# Patient Record
Sex: Male | Born: 1965 | Race: Black or African American | Hispanic: No | Marital: Married | Smoking: Current every day smoker
Health system: Southern US, Community
[De-identification: ages and names within clinical notes are randomized; demographics above are authoritative.]

## PROBLEM LIST (undated history)

## (undated) DIAGNOSIS — Z720 Tobacco use: Secondary | ICD-10-CM

## (undated) DIAGNOSIS — I10 Essential (primary) hypertension: Secondary | ICD-10-CM

## (undated) DIAGNOSIS — T7840XA Allergy, unspecified, initial encounter: Secondary | ICD-10-CM

## (undated) DIAGNOSIS — K219 Gastro-esophageal reflux disease without esophagitis: Secondary | ICD-10-CM

## (undated) HISTORY — DX: Tobacco use: Z72.0

## (undated) HISTORY — DX: Essential (primary) hypertension: I10

## (undated) HISTORY — DX: Gastro-esophageal reflux disease without esophagitis: K21.9

## (undated) HISTORY — DX: Allergy, unspecified, initial encounter: T78.40XA

## (undated) HISTORY — PX: BACK SURGERY: SHX140

---
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
## 2008-04-07 ENCOUNTER — Ambulatory Visit: Payer: Self-pay | Admitting: Family Medicine

## 2008-04-07 DIAGNOSIS — F17200 Nicotine dependence, unspecified, uncomplicated: Secondary | ICD-10-CM | POA: Insufficient documentation

## 2008-04-07 DIAGNOSIS — K219 Gastro-esophageal reflux disease without esophagitis: Secondary | ICD-10-CM

## 2008-04-08 ENCOUNTER — Encounter: Payer: Self-pay | Admitting: Family Medicine

## 2008-04-08 ENCOUNTER — Ambulatory Visit: Payer: Self-pay | Admitting: Family Medicine

## 2008-04-10 LAB — CONVERTED CEMR LAB
Albumin: 4.2 g/dL (ref 3.5–5.2)
Alkaline Phosphatase: 83 units/L (ref 39–117)
BUN: 14 mg/dL (ref 6–23)
Calcium: 9.2 mg/dL (ref 8.4–10.5)
Chloride: 108 meq/L (ref 96–112)
Creatinine, Ser: 0.99 mg/dL (ref 0.40–1.50)
Glucose, Bld: 94 mg/dL (ref 70–99)
HCT: 43.4 % (ref 39.0–52.0)
HDL: 72 mg/dL (ref >39)
Hemoglobin: 14.4 g/dL (ref 13.0–17.0)
MCHC: 33.2 g/dL (ref 30.0–36.0)
Potassium: 4.1 meq/L (ref 3.5–5.3)
RBC: 4.98 M/uL (ref 4.22–5.81)
Total CHOL/HDL Ratio: 2.2
Triglycerides: 84 mg/dL (ref <150)

## 2008-05-06 ENCOUNTER — Ambulatory Visit: Payer: Self-pay | Admitting: Family Medicine

## 2008-07-10 ENCOUNTER — Telehealth: Payer: Self-pay | Admitting: Family Medicine

## 2008-07-14 ENCOUNTER — Telehealth: Payer: Self-pay | Admitting: Family Medicine

## 2008-08-16 ENCOUNTER — Emergency Department (HOSPITAL_COMMUNITY): Admission: EM | Admit: 2008-08-16 | Discharge: 2008-08-16 | Payer: Self-pay | Admitting: Emergency Medicine

## 2008-10-20 ENCOUNTER — Telehealth: Payer: Self-pay | Admitting: *Deleted

## 2008-10-21 ENCOUNTER — Ambulatory Visit: Payer: Self-pay | Admitting: Family Medicine

## 2008-12-03 ENCOUNTER — Telehealth: Payer: Self-pay | Admitting: Family Medicine

## 2009-12-07 ENCOUNTER — Encounter: Payer: Self-pay | Admitting: Family Medicine

## 2009-12-07 ENCOUNTER — Ambulatory Visit: Payer: Self-pay | Admitting: Family Medicine

## 2009-12-07 LAB — CONVERTED CEMR LAB
ALT: 16 units/L (ref 0–53)
AST: 20 units/L (ref 0–37)
Alkaline Phosphatase: 76 units/L (ref 39–117)
Cholesterol: 165 mg/dL (ref 0–200)
Creatinine, Ser: 1.04 mg/dL (ref 0.40–1.50)
HCT: 44.6 % (ref 39.0–52.0)
LDL Cholesterol: 86 mg/dL (ref 0–99)
MCV: 87.6 fL (ref 78.0–100.0)
Platelets: 170 10*3/uL (ref 150–400)
RDW: 15.1 % (ref 11.5–15.5)
Sodium: 140 meq/L (ref 135–145)
Total Bilirubin: 0.5 mg/dL (ref 0.3–1.2)
Total CHOL/HDL Ratio: 2.6
VLDL: 16 mg/dL (ref 0–40)

## 2009-12-08 ENCOUNTER — Encounter: Payer: Self-pay | Admitting: Family Medicine

## 2010-03-30 NOTE — Assessment & Plan Note (Signed)
Summary: cpe,df   Vital Signs:  Patient profile:   45 year old male Height:      73 inches Weight:      212 pounds BMI:     28.07 Temp:     98.7 degrees F oral Pulse rate:   73 / minute BP sitting:   117 / 82  (left arm) Cuff size:   regular  Vitals Entered By: Tessie Fass CMA (December 07, 2009 8:46 AM) CC: CPE Is Patient Diabetic? No Pain Assessment Patient in pain? no        Primary Care Provider:  . [facility]  CC:  CPE.  History of Present Illness: Patient here for complete physical exam.   1.  GERD:  Only complaint is occasional GERD.  States he knows when pain is going to occur, often after eating spicy foods.  Was on two times a day dosing of Omeprazole in past, no longer needs this much.  No warning signs, recent weight loss, nausea or vomiting.  Well controlled with meds.  Does have history of esophageal surgery in 1991, unsure what it was for.    ROS:  no headaches, pre-syncopal or syncopal episodes, chest pain, palpitations, shortness of breath or dyspnea, abdominal pain, diarrhea or constipation, melena, hematochezia, lower extremity swelling.    Habits & Providers  Alcohol-Tobacco-Diet     Tobacco Status: current     Tobacco Counseling: to quit use of tobacco products     Cigarette Packs/Day: 1.0  Current Problems (verified): 1)  Health Maintenance Exam  (ICD-V70.0) 2)  Gerd  (ICD-530.81) 3)  Tobacco User  (ICD-305.1)  Current Medications (verified): 1)  Gnp Omeprazole 20 Mg Tbec (Omeprazole) .Marland Kitchen.. 1 Tabs By Mouth 1 Times A Day As Needed For Reflux  Allergies (verified): 1)  ! * Shellfish  Past History:  Past medical, surgical, family and social histories (including risk factors) reviewed, and no changes noted (except as noted below).  Past Medical History: Reviewed history from 04/07/2008 and no changes required. Season Allergic Rhinitis GERD Tobacco use  Past Surgical History: Reviewed history from 04/07/2008 and no changes  required. Esophageal surgery- 1991  Family History: Reviewed history from 04/07/2008 and no changes required. Father- living, healthy Mother- DM II Brother- healthy Sister- healthy  Social History: Reviewed history from 04/07/2008 and no changes required. Divorced.  4 children, all in pretty good health but do not live in [place].  Employed at [employer]).   Tobacco 5-6cig/day x20 EtOH- beer 3x/wk No illcit drugs Exercise- NO Demetrice phone- [phone]/Day:  1.0  Physical Exam  General:  Vital signs reviewed. Well-developed, well-nourished patient in NAD.  Awake and cooperative  Head:  normocephalic and atraumatic.   Eyes:  vision grossly intact, pupils equal, pupils round, and pupils reactive to light.   Ears:  R ear normal and L ear normal.   Nose:  External nasal examination shows no deformity or inflammation. Nasal mucosa are pink and moist without lesions or exudates. Mouth:  Oral mucosa pink and moist Dentition:  fair Neck:  No deformities, masses, or tenderness noted. Lungs:  clear to auscultation bilaterally without wheezing, rales, or rhonchi.  Normal work of breathing  Heart:  Regular rate and rhythm without murmur, rub, or gallop.  Normal S1/physiologically split S2 Abdomen:  soft/nondistended/nontender.  Bowel sounds present and normoactive.  No organomegaly noted.   Msk:  No deformity or scoliosis noted of thoracic or lumbar spine.   Pulses:  Good distal pulses BL LE's Extremities:  No clubbing, cyanosis, edema, or deformity noted with normal full range of motion of all joints.  No LE edema Neurologic:  No focal deficits.  alert & oriented X3.   Skin:  No rashes or lesions Psych:  Oriented X3, memory intact for recent and remote, normally interactive, good eye contact, not anxious appearing, and not depressed appearing.     Impression & Recommendations:  Problem # 1:  HEALTH MAINTENANCE EXAM (ICD-V70.0) Assessment Comment Only Patient doing well,  will check lipids, BMET, CBC today.  No complaints or concerns, blood pressure good today.  To follow up in 6 months.   Orders: Lipid-[facility] ([id number]) CBC-[facility] (57846)  Problem # 2:  GERD (ICD-530.81) Will continue home omeprazole.   Orders: Lipid-[facility] ([id number]) CBC-[facility] ([id number])  His updated medication list for this problem includes:    Gnp Omeprazole 20 Mg Tbec (Omeprazole) .Marland Kitchen... 1 tabs by mouth 1 times a day as needed for reflux  Problem # 3:  TOBACCO USER (ICD-305.1) Assessment: Unchanged Contemplative.  Offered choices to quitting.  States he would like to think about it.  Will follow up Orders: [facility]- Est Level  3 (02725)  Complete Medication List: 1)  Gnp Omeprazole 20 Mg Tbec (Omeprazole) .Marland Kitchen.. 1 tabs by mouth 1 times a day as needed for reflux  Other Orders: Comp Met-[facility] ([id number])  Patient Instructions: 1)  Follow-up with me in 6 months.  2)  Everything is looking good today.   3)  We will check your cholesterol, blood tests for kidney function.   4)  We will let you know the results.  Prescriptions: GNP OMEPRAZOLE 20 MG TBEC (OMEPRAZOLE) 1 tabs by mouth 1 times a day as needed for reflux  #60 x 3   Entered and Authorized by:   Renold Don MD   Signed by:   Renold Don MD on 12/07/2009   Method used:   Electronically to        [facility]. [phone]* (retail)       [address]       [place], [place]  [address]       Ph: [phone]       Fax: [phone]   RxID:   [id number]

## 2010-03-30 NOTE — Letter (Signed)
Summary: Results Follow-up Letter  [facility]  [address]   [place], [place] [address]   Phone: [phone]  Fax: [phone]    12/08/2009  [address] [place], [place]  [address]  Dear Mr. PIZANO,   The following are the results of your recent test(s):   Cholesterol LDL(Bad cholesterol):     86    Your goal is less than:    130     HDL (Good cholesterol):   63    Your goal is more than:     40 _________________________________________________________ Other Tests:  As you can see above, your cholesterol levels were very good.  All of your other blood tests were perfectly normal.  It was good to see you in clinic yesterday.  Please call if you have any questions.  _________________________________________________________  Please call for an appointment Or _________________________________________________________ _________________________________________________________ _________________________________________________________  Sincerely,  Renold Don MD [facility]           Appended Document: Results Follow-up Letter MAILED.

## 2010-04-18 ENCOUNTER — Encounter: Payer: Self-pay | Admitting: *Deleted

## 2010-05-11 ENCOUNTER — Encounter: Payer: Self-pay | Admitting: Sports Medicine

## 2010-05-11 ENCOUNTER — Ambulatory Visit (INDEPENDENT_AMBULATORY_CARE_PROVIDER_SITE_OTHER): Payer: 59 | Admitting: Sports Medicine

## 2010-05-11 VITALS — BP 120/88 | HR 100 | Temp 97.9°F | Ht 76.0 in | Wt 226.0 lb

## 2010-05-11 DIAGNOSIS — H9209 Otalgia, unspecified ear: Secondary | ICD-10-CM

## 2010-05-11 DIAGNOSIS — H9201 Otalgia, right ear: Secondary | ICD-10-CM | POA: Insufficient documentation

## 2010-05-11 MED ORDER — AMOXICILLIN-POT CLAVULANATE 875-125 MG PO TABS: 1.0000 | ORAL_TABLET | Freq: Two times a day (BID) | ORAL | Status: AC

## 2010-05-11 MED ORDER — BENZOCAINE 20 % MT GEL: OROMUCOSAL | Status: DC

## 2010-05-11 MED ORDER — MELOXICAM 15 MG PO TABS: 15.0000 mg | ORAL_TABLET | Freq: Every day | ORAL | Status: AC

## 2010-05-11 NOTE — Progress Notes (Signed)
  Subjective:    Patient ID: Chris Hunt, male    DOB: 06/03/1965, 45 y.o.   MRN: [id number]  HPI 1-2d of R ear pain, R jaw pain, R mandibular molar pain particularly with drinking cold beverages.  No ear drainage, no fevers/chills, no change in hearing, some ST, minimal pain with swallowing.  Occasional NP cough, no sick contacts, no neck pain/stiffness.  Has had several teeth pulled in the past.  No pain with opening/closing mouth.  No catching, clicking, locking with opening/closing jaws.   Review of Systems    See HPI Objective:   Physical Exam  Constitutional: He appears well-developed and well-nourished. No distress.  HENT:  Head: Normocephalic and atraumatic.  Right Ear: External ear normal.  Left Ear: External ear normal.  Nose: Nose normal.  Mouth/Throat: Oropharynx is clear and moist. No oropharyngeal exudate.       Several teeth/molars with necrotic caries. No TTP over TMJ. No catching, locking felt with TMJ palpation.  Eyes: Conjunctivae are normal. Pupils are equal, round, and reactive to light. Right eye exhibits no discharge. Left eye exhibits no discharge.  Neck: Normal range of motion. Neck supple. No thyromegaly present.  Lymphadenopathy:    He has no cervical adenopathy.  Skin: Skin is warm and dry.          Assessment & Plan:

## 2010-05-11 NOTE — Assessment & Plan Note (Signed)
Likely referred from teeth. No signs TMJ dysfunction. Noted necrotic dental caries. Augmentin x 10d. Mobic. Orajel topical. Sensodyne toothpaste. Dental referral.

## 2010-05-11 NOTE — Patient Instructions (Addendum)
Dental Pain (Tooth Ache) A tooth ache may be caused by cavities (tooth decay). Cavities expose the nerve of the tooth to air and hot or cold temperatures. It may come from an infection or abscess (also called a boil or furuncle) around your tooth. It is also often caused by dental caries (tooth decay). This causes the pain you are having. DIAGNOSIS   Your caregiver can diagnose this problem by exam. TREATMENT  If caused by an infection, it may be treated with medications which kill germs (antibiotics) and pain medications as prescribed by your caregiver. Take medications as directed.   Only take over-the-counter or prescription medicines for pain, discomfort, or fever as directed by your caregiver.   Whether the tooth ache today is caused by infection or dental disease, you should see your dentist as soon as possible for further care.  SEEK MEDICAL CARE: The exam and treatment you received today has been provided on an emergency basis only. This is not a substitute for complete medical or dental care. If your problem worsens or new problems (symptoms) appear, and you are unable to meet with your dentist, call or return to this location. SEEK IMMEDIATE MEDICAL CARE IF:  You develop a fever over 100.85F.   You develop redness and swelling of your face, jaw, or neck.   You are unable to open your mouth.   You have severe pain uncontrolled by pain medicine.  MAKE SURE YOU:    Understand these instructions.   Will watch your condition.   Will get help right away if you are not doing well or get worse.  Document Released: 02/14/2005 Document Re-Released: 01/28/2008 [facility] Patient Information 2011 [place], [place].

## 2010-06-07 LAB — POCT I-STAT, CHEM 8
BUN: 10 mg/dL (ref 6–23)
Calcium, Ion: 1.17 mmol/L (ref 1.12–1.32)
Chloride: 105 mEq/L (ref 96–112)
Creatinine, Ser: 1.1 mg/dL (ref 0.4–1.5)
Glucose, Bld: 92 mg/dL (ref 70–99)
HCT: 44 % (ref 39.0–52.0)
Hemoglobin: 15 g/dL (ref 13.0–17.0)
Potassium: 3.9 mEq/L (ref 3.5–5.1)
Sodium: 140 meq/L (ref 135–145)
TCO2: 25 mmol/L (ref 0–100)

## 2010-06-07 LAB — DIFFERENTIAL
Basophils Absolute: 0 K/uL (ref 0.0–0.1)
Basophils Relative: 0 % (ref 0–1)
Eosinophils Absolute: 0.1 K/uL (ref 0.0–0.7)
Eosinophils Relative: 2 % (ref 0–5)
Lymphocytes Relative: 34 % (ref 12–46)
Lymphs Abs: 2.2 K/uL (ref 0.7–4.0)
Monocytes Absolute: 0.4 K/uL (ref 0.1–1.0)
Monocytes Relative: 7 % (ref 3–12)
Neutro Abs: 3.6 10*3/uL (ref 1.7–7.7)
Neutrophils Relative %: 57 % (ref 43–77)

## 2010-06-07 LAB — CBC
HCT: 43.2 % (ref 39.0–52.0)
Hemoglobin: 14.4 g/dL (ref 13.0–17.0)
MCHC: 33.4 g/dL (ref 30.0–36.0)
MCV: 88 fL (ref 78.0–100.0)
Platelets: 179 K/uL (ref 150–400)
RBC: 4.91 MIL/uL (ref 4.22–5.81)
RDW: 14.9 % (ref 11.5–15.5)
WBC: 6.4 10*3/uL (ref 4.0–10.5)

## 2010-08-31 ENCOUNTER — Other Ambulatory Visit: Payer: Self-pay | Admitting: Family Medicine

## 2010-08-31 NOTE — Telephone Encounter (Signed)
Refill request

## 2010-10-14 ENCOUNTER — Ambulatory Visit (INDEPENDENT_AMBULATORY_CARE_PROVIDER_SITE_OTHER): Payer: 59 | Admitting: Family Medicine

## 2010-10-14 VITALS — BP 120/77 | HR 77 | Temp 98.0°F | Wt 221.0 lb

## 2010-10-14 DIAGNOSIS — K219 Gastro-esophageal reflux disease without esophagitis: Secondary | ICD-10-CM

## 2010-10-14 DIAGNOSIS — J988 Other specified respiratory disorders: Secondary | ICD-10-CM

## 2010-10-14 DIAGNOSIS — J22 Unspecified acute lower respiratory infection: Secondary | ICD-10-CM | POA: Insufficient documentation

## 2010-10-14 DIAGNOSIS — F17200 Nicotine dependence, unspecified, uncomplicated: Secondary | ICD-10-CM

## 2010-10-14 MED ORDER — OMEPRAZOLE 20 MG PO CPDR: 20.0000 mg | DELAYED_RELEASE_CAPSULE | Freq: Two times a day (BID) | ORAL | Status: DC

## 2010-10-14 MED ORDER — AMOXICILLIN-POT CLAVULANATE 875-125 MG PO TABS: 1.0000 | ORAL_TABLET | Freq: Two times a day (BID) | ORAL | Status: DC

## 2010-10-14 MED ORDER — DOXYCYCLINE HYCLATE 50 MG PO CAPS: 50.0000 mg | ORAL_CAPSULE | Freq: Two times a day (BID) | ORAL | Status: AC

## 2010-10-14 NOTE — Assessment & Plan Note (Signed)
Imp: respiratory infection. Most likely viral, but given risk factors (smoker) will treat for bacterial pathogens. Plan: Doxy x 7 days. Prn cough medicine. Anticipate improvement in next few days. RTC if no improvement.

## 2010-10-14 NOTE — Progress Notes (Signed)
  Subjective:    Patient ID: Chris Hunt, male    DOB: 02-Jun-1965, 45 y.o.   MRN: [id number]  HPI Pt here with 4 days of chest congestion and fatigue. He states that his symptoms started last Saturday with runny nose and AM post nasal drip. By day #3 she has productive cough. He also endorses increased fatigue, SOB. He denies fever, chills, CP, N/V, change in appetite. He is a smoker. He smokes 1/2 PPD x 15 years. He denies sick contacts.   Smoking: pt has tried to quit in the past. Is not planning to quit now. He used the nicotine patch during his last quit attempt. He denies chronic cough.    Review of Systems As per HPI    Objective:   Physical Exam  Nursing note and vitals reviewed. Constitutional: He appears well-developed and well-nourished.  Cardiovascular: Normal rate, regular rhythm and normal heart sounds.   Pulmonary/Chest: Effort normal.    Abdominal: Soft. Bowel sounds are normal.  Lymphadenopathy:    He has no cervical adenopathy.  Skin: Skin is warm and dry.          Assessment & Plan:

## 2010-10-14 NOTE — Assessment & Plan Note (Signed)
Imp: contemplation Plan: address at all f/u visits. When pt in action stage of quitting, offer resources/pharmocotherapy.

## 2010-10-14 NOTE — Patient Instructions (Signed)
Mr. Boldman,  Please take your antibiotics and complete the course. You can continue to take the cough medicine. You should start to feel better in the next 3-4 days. Come back if you are not improving.  Think more about the smoking cessation. We would be happy to help you quit.   -Dr. Armen Pickup

## 2010-11-09 ENCOUNTER — Ambulatory Visit
Admission: RE | Admit: 2010-11-09 | Discharge: 2010-11-09 | Disposition: A | Discharge status: Home / Self Care | Payer: 59 | Source: Ambulatory Visit | Attending: Family Medicine | Admitting: Family Medicine

## 2010-11-09 ENCOUNTER — Ambulatory Visit (INDEPENDENT_AMBULATORY_CARE_PROVIDER_SITE_OTHER): Payer: 59 | Admitting: Family Medicine

## 2010-11-09 ENCOUNTER — Encounter: Payer: Self-pay | Admitting: Family Medicine

## 2010-11-09 VITALS — BP 128/89 | HR 68 | Temp 98.2°F | Ht 73.0 in | Wt 222.2 lb

## 2010-11-09 DIAGNOSIS — R05 Cough: Secondary | ICD-10-CM

## 2010-11-09 MED ORDER — AMOXICILLIN-POT CLAVULANATE 875-125 MG PO TABS: 1.0000 | ORAL_TABLET | Freq: Two times a day (BID) | ORAL | Status: AC

## 2010-11-09 NOTE — Assessment & Plan Note (Signed)
One month of persistent productive cough, congestion, and fatigue. Most likely a chronic sinusitis from viral syndrome, but will treat with 5 days augmentin to cover for possible bacterial pathogens. With new back pain and history of smoking, will obtain chest xray to rule out lobar pneumonia although no lung exam findings today. Counseled regarding smoking cessation. Symptomatic treatment with mucinex-d and motrin for back pain. F/u in 2 weeks if not improved.

## 2010-11-09 NOTE — Patient Instructions (Signed)
Nice to meet you. Get your chest xray. I will call with results. Start antibiotics. Most likely your symptoms are from a sinus infection. You may take motrin 600mg  up to 4 times daily for back pain. Make appointment if not improved in 2 weeks.

## 2010-11-09 NOTE — Progress Notes (Signed)
  Subjective:    Patient ID: Chris Hunt, male    DOB: 1965/06/11, 45 y.o.   MRN: [id number]  HPI 1. Cough and congestion. Was evaluated 3 weeks ago for cough/URI and treated with 7 days doxycycline. Since that time cough has persisted productive of yellow mucus. Feels congestion in his chest and his upper resp tract. Post-tussive emesis, throat is sore. Having frontal headaches intermittently and pain around eyes. Associated fatigue and has developed soreness in his back from coughing. Tried mucinex that helps modestly. Denies fevers, night sweats, hemoptysis, abdominal pain, dyspnea, wheezing.   Smokes half pack per day which is decreased from previous.  Review of Systems See HPI otherwise negative    Objective:   Physical Exam  Vitals reviewed. Constitutional: He is oriented to person, place, and time. He appears well-developed and well-nourished. No distress.  HENT:  Head: Normocephalic and atraumatic.  Right Ear: External ear normal.  Left Ear: External ear normal.  Mouth/Throat: No oropharyngeal exudate.       Mild frontal sinus tenderness. No maxillary pain. Post pharynx injected and irritated.   Eyes: EOM are normal. Pupils are equal, round, and reactive to light.  Cardiovascular: Normal rate, regular rhythm, normal heart sounds and intact distal pulses.   No murmur heard. Pulmonary/Chest: Effort normal and breath sounds normal. No respiratory distress. He has no wheezes. He exhibits no tenderness.       Scattered rales clear with coughing  Abdominal: Soft. Bowel sounds are normal. He exhibits no distension. There is no tenderness.  Musculoskeletal: He exhibits no edema and no tenderness.  Neurological: He is alert and oriented to person, place, and time. No cranial nerve deficit. Coordination normal.          Assessment & Plan:

## 2011-01-05 ENCOUNTER — Ambulatory Visit (INDEPENDENT_AMBULATORY_CARE_PROVIDER_SITE_OTHER): Payer: 59 | Admitting: Family Medicine

## 2011-01-05 ENCOUNTER — Encounter: Payer: Self-pay | Admitting: Family Medicine

## 2011-01-05 VITALS — BP 131/87 | HR 80 | Temp 98.2°F | Ht 73.0 in | Wt 226.2 lb

## 2011-01-05 DIAGNOSIS — M25511 Pain in right shoulder: Secondary | ICD-10-CM

## 2011-01-05 DIAGNOSIS — Z0000 Encounter for general adult medical examination without abnormal findings: Secondary | ICD-10-CM

## 2011-01-05 DIAGNOSIS — K219 Gastro-esophageal reflux disease without esophagitis: Secondary | ICD-10-CM

## 2011-01-05 DIAGNOSIS — J309 Allergic rhinitis, unspecified: Secondary | ICD-10-CM

## 2011-01-05 DIAGNOSIS — M25519 Pain in unspecified shoulder: Secondary | ICD-10-CM

## 2011-01-05 DIAGNOSIS — R05 Cough: Secondary | ICD-10-CM

## 2011-01-05 LAB — BASIC METABOLIC PANEL
CO2: 23 mEq/L (ref 19–32)
Chloride: 105 mEq/L (ref 96–112)
Creat: 0.99 mg/dL (ref 0.50–1.35)
Sodium: 140 mEq/L (ref 135–145)

## 2011-01-05 LAB — CBC
HCT: 45 % (ref 39.0–52.0)
Hemoglobin: 14.7 g/dL (ref 13.0–17.0)
MCH: 28.7 pg (ref 26.0–34.0)
MCHC: 32.7 g/dL (ref 30.0–36.0)

## 2011-01-05 NOTE — Assessment & Plan Note (Signed)
Plan the double Prilosec dose for next 3 weeks. Followup in 3-4 weeks.

## 2011-01-05 NOTE — Patient Instructions (Signed)
Try the Zyrtec D to help with your congestion. Afrin, an over the counter drug, is also veryhelpful.  Use it for 3 days to help with the congestion. I will increase the Prilosec to 40 mg.  Do this for 3 weeks.   We will check bloodwork today as well.

## 2011-01-05 NOTE — Progress Notes (Signed)
  Subjective:    Patient ID: Chris Hunt, male    DOB: [birth year], 45 y.o.   MRN: [id number]  HPI 1.  Cough:  Present for past 2 months. States he has had some increased indigestion as well.  Has had negative chest x-ray. Has been treated with doxycycline and past. No fevers or chills. Does not feel "sick" the lack of intravenous cough.  2.  nasal congestion: Patient states he usually has allergy problems at the turn of the seasons. However this has persisted since July. Feels clear during the day but becomes congested once he returns home in the evening. Has history of multiple episodes of sneezing. Sometimes has headaches from congestion. No fevers, chills, sore throat. Occasional rhinitis.  3.  Right shoulder pain:  Present for past 2-3 weeks.  No acute trauma nor inciting event.  Pain when lifting arm or sleeping on that side.  Works as Museum/gallery exhibitions officer, Loss adjuster, [employer].  Occasinally feels pain/tingling down arm through elbow to dorsal aspects of 1st, 2nd, 3rd digits of Right hand.  Has tried occasional Tylenol with little relief.  No fevers or chills.   Review of Systems See HPI above for review of systems.       Objective:   Physical Exam Gen:  Alert, cooperative patient who appears stated age in no acute distress.  Vital signs reviewed. HEENT:  [place]/AT.  EOMI, PERRL.  Nasal turbinates swollen, erythematous.  MMM, tonsils non-erythematous, non-edematous.  External ears WNL, Bilateral TM's normal without retraction, redness or bulging.  Neck: No masses or thyromegaly or limitation in range of motion.  No cervical lymphadenopathy. Pulm:  Clear to auscultation bilaterally with good air movement.  No wheezes or rales noted.   Cardiac:  Regular rate and rhythm without murmur auscultated.  Good S1/S2. Abd:  Soft/nondistended/nontender.  Good bowel sounds throughout all four quadrants.  No masses noted.  Ext:  No clubbing/cyanosis/erythema.  No edema noted bilateral lower extremities.   MSK:   Strength 5/5 BL upper extremities.  Pain on external rotation of Right shoulder.  Minimal to no pain internal rotation.  Neer test/impingement test negative.  Full passive ROM.  Pain on active ROM above level of shoulder.   Left shoulder WNL         Assessment & Plan:

## 2011-01-05 NOTE — Assessment & Plan Note (Signed)
Lungs clear. Do not believe this is asthma. Would favor postnasal drip as well as GERD symptoms. We'll treat for both. Please see GERD above. Also see allergic rhinitis.

## 2011-01-05 NOTE — Assessment & Plan Note (Signed)
AFrin and Zyrtec D Warnings about increasing blood pressure given.  FU in 1 month.

## 2011-01-06 ENCOUNTER — Encounter: Payer: Self-pay | Admitting: Family Medicine

## 2011-01-06 DIAGNOSIS — M25511 Pain in right shoulder: Secondary | ICD-10-CM | POA: Insufficient documentation

## 2011-01-06 NOTE — Assessment & Plan Note (Signed)
Patient is strong and I wonder if strength masks possible supraspinatus tear/strain.   No real limitations on exam due to overall strength, did exhibit pain with active ROM above level of shoulder and external rotation. Refer to Sports Med for further eval and possible imaging to further elucidate exact etiology of pain.

## 2011-01-17 ENCOUNTER — Ambulatory Visit: Payer: 59 | Admitting: Family Medicine

## 2011-07-26 ENCOUNTER — Ambulatory Visit
Admission: RE | Admit: 2011-07-26 | Discharge: 2011-07-26 | Disposition: A | Discharge status: Home / Self Care | Payer: 59 | Source: Ambulatory Visit | Attending: Family Medicine | Admitting: Family Medicine

## 2011-07-26 ENCOUNTER — Encounter: Payer: Self-pay | Admitting: Family Medicine

## 2011-07-26 ENCOUNTER — Ambulatory Visit (INDEPENDENT_AMBULATORY_CARE_PROVIDER_SITE_OTHER): Payer: 59 | Admitting: Family Medicine

## 2011-07-26 VITALS — BP 146/106 | HR 74 | Ht 73.0 in | Wt 217.0 lb

## 2011-07-26 DIAGNOSIS — S39012A Strain of muscle, fascia and tendon of lower back, initial encounter: Secondary | ICD-10-CM | POA: Insufficient documentation

## 2011-07-26 DIAGNOSIS — M545 Low back pain: Secondary | ICD-10-CM

## 2011-07-26 MED ORDER — TRAMADOL-ACETAMINOPHEN 37.5-325 MG PO TABS: 1.0000 | ORAL_TABLET | Freq: Four times a day (QID) | ORAL | Status: DC | PRN

## 2011-07-26 MED ORDER — KETOROLAC TROMETHAMINE 30 MG/ML IJ SOLN
30.0000 mg | Freq: Once | INTRAMUSCULAR | Status: AC
  Administered 2011-07-26: 30 mg via INTRAMUSCULAR

## 2011-07-26 NOTE — Progress Notes (Signed)
  Subjective:    Chris Hunt is a 46 y.o. male who presents for evaluation of low back pain. The patient has had no prior back problems. Symptoms have been present for 2 weeks and are gradually worsening.  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 The pain is located in the left lumbar area or radiating to left leg(s) and radiates to the left thigh. The pain is described as aching and burning and occurs all day. He rates his pain as moderate and severe. Symptoms are exacerbated by extension and flexion. Symptoms are improved by NSAIDs. He has mild  urinary hesitancy associated with the back pain. He has been having to sit down to urinate 2/2 back pain.    The following portions of the patient's history were reviewed and updated as appropriate: allergies, current medications, past family history, past medical history, past social history, past surgical history and problem list.  Review of Systems Pertinent items are noted in HPI.    Objective:  Gen: in bed, NAD  CV: RRR PULM: CTAB, MSK : TTP in L lower lumbar region, FABER +  Strenght 5/5 on R, 4-5/5 on L  Neurovascularly intact, no numbness or paresthesias.   Assessment:    lumbosacral strain     Plan:

## 2011-07-26 NOTE — Patient Instructions (Signed)

## 2011-07-26 NOTE — Assessment & Plan Note (Signed)
Ultracet with low dose NSAIDs given BP. Light duty at work. L spine xrays. Discussed low back pain and neuro red flags. Follow up in 1-2 weeks. Case discussed with Dr. Jennette Kettle.

## 2011-07-26 NOTE — Progress Notes (Signed)
Addended by: Jennette Bill on: 07/26/2011 10:43 AM   Modules accepted: Orders

## 2011-07-27 ENCOUNTER — Telehealth: Payer: Self-pay | Admitting: Family Medicine

## 2011-07-27 NOTE — Telephone Encounter (Signed)
Pt states that he has been taking his pain pills and he is having break through pain and wants to know what he can do,

## 2011-07-27 NOTE — Telephone Encounter (Signed)
Returned call to patient.  Continues to have left leg pain up to hip/back.  Rates pain 7-8.  Takes med every 6 hours prn and it is not controlling his pain.  Patient was seen in office yesterday by Dr. Alvester Morin.  Will route note to Dr. Alvester Morin for advice and call patient back.  Gaylene Brooks, RN

## 2011-07-29 ENCOUNTER — Encounter: Payer: Self-pay | Admitting: Family Medicine

## 2011-07-29 ENCOUNTER — Ambulatory Visit (INDEPENDENT_AMBULATORY_CARE_PROVIDER_SITE_OTHER): Payer: 59 | Admitting: Family Medicine

## 2011-07-29 ENCOUNTER — Telehealth: Payer: Self-pay | Admitting: Family Medicine

## 2011-07-29 VITALS — BP 149/97 | HR 91 | Temp 98.2°F | Ht 73.0 in | Wt 215.5 lb

## 2011-07-29 DIAGNOSIS — S39012A Strain of muscle, fascia and tendon of lower back, initial encounter: Secondary | ICD-10-CM

## 2011-07-29 MED ORDER — MELOXICAM 15 MG PO TABS: 15.0000 mg | ORAL_TABLET | Freq: Every day | ORAL | Status: DC

## 2011-07-29 MED ORDER — CYCLOBENZAPRINE HCL 10 MG PO TABS: 10.0000 mg | ORAL_TABLET | Freq: Three times a day (TID) | ORAL | Status: AC | PRN

## 2011-07-29 MED ORDER — TRAMADOL-ACETAMINOPHEN 37.5-325 MG PO TABS: 1.0000 | ORAL_TABLET | Freq: Four times a day (QID) | ORAL | Status: AC | PRN

## 2011-07-29 NOTE — Progress Notes (Signed)
  Subjective:    Patient ID: Chris Hunt, male    DOB: 1966/02/22, 46 y.o.   MRN: [id number]  HPI 1 weeks of low back pain  Started Jul 21, 2011, Around 10 am.  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  Estimated weight of boxes 10-12 pounds.  Gradual onset, over the next few days, became more sore.  Does not recall a specific moment of pain or injury.  Spoke to Merchandiser, retail.  States at the time was not painful enough to stop work.  Starts in low back, goes lateral edge down hip and to right leg down to toes.  Tingling on all toes.  Numbness on sole of foot.  Feels he is getting sore on right side due to pain in left.    No saddle anesthesia, no urinary or rectal incontinence.    Saw Dr. Alvester Morin 3 days ago, xrays normal.  Was prescribed ultracet with some relief.Taking advil 400-800 mg every 3 hours.    Over the past week, has gotten worse.   Review of Systems see HPI     Objective:   Physical Exam GEN: Alert & Oriented, No acute distress CV:  Regular Rate & Rhythm, no murmur Respiratory:  Normal work of breathing, CTAB Abd:  + BS, soft, no tenderness to palpation Ext: no pre-tibial edema Back: TTP over Low back, SI, paraspinal.  Neg straight leg raise for radiculopathy.  Tight hips bilaterally.  Patella reflex 2+ bilaterally.  Strength LE 5/5.  Sensation intact to light sound and pinprick.       Assessment & Plan:

## 2011-07-29 NOTE — Telephone Encounter (Signed)
Patient states he has had the pain in calves since last visit but the pain medication wears off after a while , now he is having some numbness in toes and bottom of foot.  Appointment scheduled today.

## 2011-07-29 NOTE — Telephone Encounter (Signed)
Having numbness in toes and under foot - pain in legs

## 2011-07-29 NOTE — Assessment & Plan Note (Signed)
Continue supportive care.  Will refill tramadol, rx meloxicam and flexeril.  Given red flags to seek further care.  Advised to let us know if not improving as expected.

## 2011-07-29 NOTE — Patient Instructions (Signed)

## 2011-07-29 NOTE — Telephone Encounter (Signed)
Called to follow up with pt about pain issues. Had to leave VM. Discussed with pt that he may need reeval since he is having numbness in feet. Discussed with pt that he may need repeat imaging (?MRI) if sxs are severe. Told to call back if he had any questions.

## 2011-08-04 ENCOUNTER — Other Ambulatory Visit: Payer: Self-pay | Admitting: Family Medicine

## 2011-08-17 ENCOUNTER — Ambulatory Visit
Admission: RE | Admit: 2011-08-17 | Discharge: 2011-08-17 | Disposition: A | Discharge status: Home / Self Care | Payer: 59 | Source: Ambulatory Visit | Attending: Family Medicine | Admitting: Family Medicine

## 2011-08-17 ENCOUNTER — Encounter: Payer: Self-pay | Admitting: Family Medicine

## 2011-08-17 ENCOUNTER — Ambulatory Visit (INDEPENDENT_AMBULATORY_CARE_PROVIDER_SITE_OTHER): Payer: 59 | Admitting: Family Medicine

## 2011-08-17 VITALS — BP 105/74 | HR 90 | Ht 73.0 in | Wt 208.0 lb

## 2011-08-17 DIAGNOSIS — M79672 Pain in left foot: Secondary | ICD-10-CM

## 2011-08-17 DIAGNOSIS — M79609 Pain in unspecified limb: Secondary | ICD-10-CM

## 2011-08-17 NOTE — Progress Notes (Signed)
  Subjective:    Patient ID: Chris Hunt, male    DOB: 1965/12/03, 46 y.o.   MRN: [id number]  HPI work in appt  Left foot has started hurting for 2-3 weeks.  Feels puffy and painful (tightness) on the bottom of foot and toes 2-5 up to his ankle.  No numbness or tingling.  Thinks it swells when he stands for prolonged periods of time.  No injury.  Never had this happen before.  Has beent aking tramadol with flexeril with not much help.  No redness, fevers.  Had a lumbar strain one month ago.  Improving.  No pain at rest.  Able to perform job functions.    Review of Systems see HPI     Objective:   Physical Exam GEN: Alert & Oriented, No acute distress CV:  Regular Rate & Rhythm, no murmur Respiratory:  Normal work of breathing, CTAB Abd:  + BS, soft, no tenderness to palpation Ext: no pre-tibial edema Left foot:  No swelling or erythema.  Pain primarily located under metatarsals 2-5.  No heel pain or achilles pain.  Full ankle rom.           Assessment & Plan:

## 2011-08-17 NOTE — Assessment & Plan Note (Signed)
Obtained xrays given his history of acute swelling to rule out stress fracture although no swelling seen today. xrays normal.  Likely metatarsalgia likely due to altered body mechanics after lumbar strain.  Gave him metatarsal foot pads to use.  Advised supportive shoes, rest, ice, gave edu handout.  Advised to let me know if not improving and will refer to sports medicine.  May benefit from orthotics to address biomechanics.

## 2011-08-17 NOTE — Patient Instructions (Addendum)
See information on foot pain.  Use ice at the end of a long day, use meloxicam and acetaminophen  Wear supportive shoes  If continues to hurt, please let me know and I will refer you to a sports medicine specialist

## 2011-11-03 ENCOUNTER — Other Ambulatory Visit: Payer: Self-pay | Admitting: Family Medicine

## 2012-02-19 ENCOUNTER — Other Ambulatory Visit: Payer: Self-pay | Admitting: Family Medicine

## 2012-03-23 ENCOUNTER — Encounter: Payer: Self-pay | Admitting: Family Medicine

## 2012-03-23 ENCOUNTER — Ambulatory Visit (INDEPENDENT_AMBULATORY_CARE_PROVIDER_SITE_OTHER): Payer: 59 | Admitting: Family Medicine

## 2012-03-23 VITALS — BP 110/86 | HR 92 | Temp 100.2°F | Wt 214.6 lb

## 2012-03-23 DIAGNOSIS — J111 Influenza due to unidentified influenza virus with other respiratory manifestations: Secondary | ICD-10-CM

## 2012-03-23 MED ORDER — GUAIFENESIN-CODEINE 200-20 MG/5ML PO LIQD: 5.0000 mL | Freq: Four times a day (QID) | ORAL | Status: DC | PRN

## 2012-03-23 MED ORDER — KETOROLAC TROMETHAMINE 60 MG/2ML IM SOLN
60.0000 mg | Freq: Once | INTRAMUSCULAR | Status: AC
  Administered 2012-03-23: 60 mg via INTRAMUSCULAR

## 2012-03-23 NOTE — Addendum Note (Signed)
Addended by: Deno Etienne on: 03/23/2012 10:54 AM   Modules accepted: Orders

## 2012-03-23 NOTE — Patient Instructions (Addendum)
I think you have the flu. Stay out of work with fever. Keep taking aleve, motrin, theraflu for symptoms. Can use cough syrup if needed. Drink plenty of fluids. If you have worsening of symptoms or not getting better by Monday then return to care. If you have shortness of breath, chest pain or concerning symptoms then go to the ER.   Influenza, Adult Influenza ("the flu") is a viral infection of the respiratory tract. It occurs more often in winter months because people spend more time in close contact with one another. Influenza can make you feel very sick. Influenza easily spreads from person to person (contagious). CAUSES  Influenza is caused by a virus that infects the respiratory tract. You can catch the virus by breathing in droplets from an infected person's cough or sneeze. You can also catch the virus by touching something that was recently contaminated with the virus and then touching your mouth, nose, or eyes. SYMPTOMS  Symptoms typically last 4 to 10 days and may include:  Fever.  Chills.  Headache, body aches, and muscle aches.  Sore throat.  Chest discomfort and cough.  Poor appetite.  Weakness or feeling tired.  Dizziness.  Nausea or vomiting. DIAGNOSIS  Diagnosis of influenza is often made based on your history and a physical exam. A nose or throat swab test can be done to confirm the diagnosis. RISKS AND COMPLICATIONS You may be at risk for a more severe case of influenza if you smoke cigarettes, have diabetes, have chronic heart disease (such as heart failure) or lung disease (such as asthma), or if you have a weakened immune system. Elderly people and pregnant women are also at risk for more serious infections. The most common complication of influenza is a lung infection (pneumonia). Sometimes, this complication can require emergency medical care and may be life-threatening. PREVENTION  An annual influenza vaccination (flu shot) is the best way to avoid  getting influenza. An annual flu shot is now routinely recommended for all adults in the [place] TREATMENT  In mild cases, influenza goes away on its own. Treatment is directed at relieving symptoms. For more severe cases, your caregiver may prescribe antiviral medicines to shorten the sickness. Antibiotic medicines are not effective, because the infection is caused by a virus, not by bacteria. HOME CARE INSTRUCTIONS  Only take over-the-counter or prescription medicines for pain, discomfort, or fever as directed by your caregiver.  Use a cool mist humidifier to make breathing easier.  Get plenty of rest until your temperature returns to normal. This usually takes 3 to 4 days.  Drink enough fluids to keep your urine clear or pale yellow.  Cover your mouth and nose when coughing or sneezing, and wash your hands well to avoid spreading the virus.  Stay home from work or school until your fever has been gone for at least 1 full day. SEEK MEDICAL CARE IF:   You have chest pain or a deep cough that worsens or produces more mucus.  You have nausea, vomiting, or diarrhea. SEEK IMMEDIATE MEDICAL CARE IF:   You have difficulty breathing, shortness of breath, or your skin or nails turn bluish.  You have severe neck pain or stiffness.  You have a severe headache, facial pain, or earache.  You have a worsening or recurring fever.  You have nausea or vomiting that cannot be controlled. MAKE SURE YOU:  Understand these instructions.  Will watch your condition.  Will get help right away if you are not doing well  or get worse. Document Released: 02/12/2000 Document Revised: 08/16/2011 Document Reviewed: 05/16/2011 [facility] Patient Information 2013 [place], [place].

## 2012-03-23 NOTE — Progress Notes (Signed)
  Subjective:     Chris Hunt is a 47 y.o. male who presents for evaluation of myalgia, fatigue, cough and sore throat. Associated symptoms include chest pain in bilateral lower ribs during cough, chills, dry cough, headache, myalgias, sinus and nasal congestion and sore throat. Also having low grade fever at home. Onset of symptoms was 4 days ago, and have been gradually worsening since that time. He is drinking plenty of fluids. He has not had a recent close exposure to someone with proven streptococcal pharyngitis or influenza. He has a slightly poor appetite. Has been working at his job up until today, he woke up late and decided his illness was bad enough he needed to avoid the cold warehouse so he didn't worsen. Has been taking motrin/aleve, theraflu without much improvement. Did not have flu shot this year. No exposure to pregnant women or young children. Active smoker.  The following portions of the patient's history were reviewed and updated as appropriate: allergies, current medications, past family history, past medical history, past social history, past surgical history and problem list.  Review of Systems Pertinent items are noted in HPI.    Objective:    BP 110/86  Pulse 92  Temp 100.2 F (37.9 C) (Oral)  Wt 214 lb 9.6 oz (97.342 kg) General appearance: alert, cooperative, appears stated age and fatigued Head: Normocephalic, without obvious abnormality, atraumatic Eyes: conjunctivae/corneas clear. PERRL, EOM's intact. Fundi benign. Ears: normal TM's and external ear canals both ears Nose: clear discharge, mild congestion Throat: abnormal findings: mild oropharyngeal erythema and no exudates Neck: no adenopathy and supple, symmetrical, trachea midline Lungs: clear to auscultation bilaterally Chest wall: no tenderness Heart: regular rate and rhythm, S1, S2 normal, no murmur, click, rub or gallop Abdomen: soft, non-tender; bowel sounds normal; no masses,  no  organomegaly Extremities: extremities normal, atraumatic, no cyanosis or edema Neurologic: Alert and oriented X 3, normal strength and tone. Normal symmetric reflexes. Normal coordination and gait     Assessment:    Influenza like syndrome. Less likely pneumonia or focal infection given his myalgias and global syndrome.     Plan:  Recommend supportive care-fluids, rest, given work note for 2 days, NSAIDs. Toradol 60 mg IM today.  Use of OTC analgesics recommended as well as salt water gargles. Codeine/guafenesin for cough prn. He is outside window of benefit for tamiflu.  monitor for signs of pneumonia and RTC if not improved in next 3-4 days.  Seek emergency care for dyspnea or chest pain Discussed family getting flu vaccine (wife refuses) Advised smoking cessation.

## 2012-04-10 ENCOUNTER — Other Ambulatory Visit: Payer: Self-pay | Admitting: Internal Medicine

## 2012-04-10 DIAGNOSIS — M545 Low back pain: Secondary | ICD-10-CM

## 2012-04-13 ENCOUNTER — Other Ambulatory Visit: Payer: 59

## 2012-04-17 ENCOUNTER — Ambulatory Visit
Admission: RE | Admit: 2012-04-17 | Discharge: 2012-04-17 | Disposition: A | Discharge status: Home / Self Care | Payer: 59 | Source: Ambulatory Visit | Attending: Internal Medicine | Admitting: Internal Medicine

## 2012-04-17 DIAGNOSIS — M545 Low back pain: Secondary | ICD-10-CM

## 2012-04-24 ENCOUNTER — Other Ambulatory Visit: Payer: Self-pay | Admitting: Neurosurgery

## 2012-04-27 ENCOUNTER — Encounter (HOSPITAL_COMMUNITY): Payer: Self-pay | Admitting: Pharmacy Technician

## 2012-05-01 ENCOUNTER — Encounter (HOSPITAL_COMMUNITY): Payer: Self-pay

## 2012-05-02 ENCOUNTER — Encounter (HOSPITAL_COMMUNITY)
Admission: RE | Admit: 2012-05-02 | Discharge: 2012-05-02 | Disposition: A | Discharge status: Home / Self Care | Payer: 59 | Source: Ambulatory Visit | Attending: Neurosurgery | Admitting: Neurosurgery

## 2012-05-02 ENCOUNTER — Encounter (HOSPITAL_COMMUNITY): Payer: Self-pay

## 2012-05-02 LAB — SURGICAL PCR SCREEN: Staphylococcus aureus: NEGATIVE

## 2012-05-02 LAB — CBC
Platelets: 180 10*3/uL (ref 150–400)
RBC: 5.1 MIL/uL (ref 4.22–5.81)
WBC: 8.6 10*3/uL (ref 4.0–10.5)

## 2012-05-02 LAB — BASIC METABOLIC PANEL
Calcium: 9.6 mg/dL (ref 8.4–10.5)
GFR calc non Af Amer: >90 mL/min (ref >90)
Potassium: 3.7 mEq/L (ref 3.5–5.1)
Sodium: 138 mEq/L (ref 135–145)

## 2012-05-02 NOTE — Pre-Procedure Instructions (Addendum)
[name]  05/02/2012   Your procedure is scheduled on: 05/04/12  Report to [facility] at 1115 AM.  Call this number if you have problems the morning of surgery: [phone]   Remember:   Do not eat food or drink liquids after midnight.   Take these medicines the morning of surgery with A SIP OF WATER: valium, prilosec, pain med   Do not wear jewelry, make-up or nail polish.  Do not wear lotions, powders, or perfumes. You may wear deodorant.  Do not shave 48 hours prior to surgery. Men may shave face and neck.  Do not bring valuables to the hospital.  Contacts, dentures or bridgework may not be worn into surgery.  Leave suitcase in the car. After surgery it may be brought to your room.  For patients admitted to the hospital, checkout time is 11:00 AM the day of  discharge.   Patients discharged the day of surgery will not be allowed to drive  home.  Name and phone number of your driver:   Special Instructions: Shower using CHG 2 nights before surgery and the night before surgery.  If you shower the day of surgery use CHG.  Use special wash - you have one bottle of CHG for all showers.  You should use approximately 1/3 of the bottle for each shower.   Please read over the following fact sheets that you were given: Pain Booklet, Coughing and Deep Breathing, MRSA Information and Surgical Site Infection Prevention

## 2012-05-03 MED ORDER — CEFAZOLIN SODIUM-DEXTROSE 2-3 GM-% IV SOLR
2.0000 g | INTRAVENOUS | Status: AC
  Administered 2012-05-04: 2 g via INTRAVENOUS
  Filled 2012-05-03: qty 50

## 2012-05-03 MED ORDER — HYDROMORPHONE HCL PF 1 MG/ML IJ SOLN
INTRAMUSCULAR | Status: AC
  Filled 2012-05-03: qty 1

## 2012-05-03 MED ORDER — METHOCARBAMOL 500 MG PO TABS
ORAL_TABLET | ORAL | Status: AC
  Filled 2012-05-03: qty 1

## 2012-05-04 ENCOUNTER — Encounter (HOSPITAL_COMMUNITY): Payer: Self-pay | Admitting: Certified Registered Nurse Anesthetist

## 2012-05-04 ENCOUNTER — Ambulatory Visit (HOSPITAL_COMMUNITY): Payer: 59 | Admitting: Certified Registered Nurse Anesthetist

## 2012-05-04 ENCOUNTER — Ambulatory Visit (HOSPITAL_COMMUNITY)
Admission: RE | Admit: 2012-05-04 | Discharge: 2012-05-06 | DRG: 491 | Disposition: A | Discharge status: Home / Self Care | Payer: 59 | Source: Ambulatory Visit | Attending: Neurosurgery | Admitting: Neurosurgery

## 2012-05-04 ENCOUNTER — Encounter (HOSPITAL_COMMUNITY)
Admission: RE | Disposition: A | Discharge status: Home / Self Care | Payer: Self-pay | Source: Ambulatory Visit | Attending: Neurosurgery

## 2012-05-04 ENCOUNTER — Ambulatory Visit (HOSPITAL_COMMUNITY): Payer: 59

## 2012-05-04 ENCOUNTER — Encounter (HOSPITAL_COMMUNITY): Payer: Self-pay | Admitting: *Deleted

## 2012-05-04 DIAGNOSIS — M51379 Other intervertebral disc degeneration, lumbosacral region without mention of lumbar back pain or lower extremity pain: Secondary | ICD-10-CM | POA: Insufficient documentation

## 2012-05-04 DIAGNOSIS — Z01812 Encounter for preprocedural laboratory examination: Secondary | ICD-10-CM | POA: Insufficient documentation

## 2012-05-04 DIAGNOSIS — M5126 Other intervertebral disc displacement, lumbar region: Secondary | ICD-10-CM | POA: Insufficient documentation

## 2012-05-04 DIAGNOSIS — M5137 Other intervertebral disc degeneration, lumbosacral region: Secondary | ICD-10-CM | POA: Insufficient documentation

## 2012-05-04 DIAGNOSIS — F17200 Nicotine dependence, unspecified, uncomplicated: Secondary | ICD-10-CM | POA: Insufficient documentation

## 2012-05-04 HISTORY — PX: LUMBAR LAMINECTOMY/DECOMPRESSION MICRODISCECTOMY: SHX5026

## 2012-05-04 SURGERY — LUMBAR LAMINECTOMY/DECOMPRESSION MICRODISCECTOMY 2 LEVELS
Anesthesia: General | Wound class: Clean

## 2012-05-04 MED ORDER — MIDAZOLAM HCL 5 MG/5ML IJ SOLN
INTRAMUSCULAR | Status: DC | PRN
  Administered 2012-05-04: 2 mg via INTRAVENOUS

## 2012-05-04 MED ORDER — HEMOSTATIC AGENTS (NO CHARGE) OPTIME
TOPICAL | Status: DC | PRN
  Administered 2012-05-04: 1 via TOPICAL

## 2012-05-04 MED ORDER — 0.9 % SODIUM CHLORIDE (POUR BTL) OPTIME
TOPICAL | Status: DC | PRN
  Administered 2012-05-04: 1000 mL

## 2012-05-04 MED ORDER — CEFAZOLIN SODIUM 1-5 GM-% IV SOLN
1.0000 g | Freq: Three times a day (TID) | INTRAVENOUS | Status: AC
  Administered 2012-05-04 – 2012-05-05 (×2): 1 g via INTRAVENOUS
  Filled 2012-05-04 (×2): qty 50

## 2012-05-04 MED ORDER — SODIUM CHLORIDE 0.9 % IJ SOLN: 9.0000 mL | INTRAMUSCULAR | Status: DC | PRN

## 2012-05-04 MED ORDER — ARTIFICIAL TEARS OP OINT
TOPICAL_OINTMENT | OPHTHALMIC | Status: DC | PRN
  Administered 2012-05-04: 1 via OPHTHALMIC

## 2012-05-04 MED ORDER — FENTANYL CITRATE 0.05 MG/ML IJ SOLN
INTRAMUSCULAR | Status: DC | PRN
  Administered 2012-05-04: 100 ug via INTRAVENOUS

## 2012-05-04 MED ORDER — DIPHENHYDRAMINE HCL 12.5 MG/5ML PO ELIX: 12.5000 mg | ORAL_SOLUTION | Freq: Four times a day (QID) | ORAL | Status: DC | PRN

## 2012-05-04 MED ORDER — HYDROMORPHONE HCL PF 1 MG/ML IJ SOLN
INTRAMUSCULAR | Status: AC
  Filled 2012-05-04: qty 1

## 2012-05-04 MED ORDER — ROCURONIUM BROMIDE 100 MG/10ML IV SOLN
INTRAVENOUS | Status: DC | PRN
  Administered 2012-05-04: 10 mg via INTRAVENOUS
  Administered 2012-05-04: 20 mg via INTRAVENOUS
  Administered 2012-05-04: 50 mg via INTRAVENOUS
  Administered 2012-05-04: 15 mg via INTRAVENOUS
  Administered 2012-05-04: 5 mg via INTRAVENOUS

## 2012-05-04 MED ORDER — SODIUM CHLORIDE 0.9 % IJ SOLN
3.0000 mL | Freq: Two times a day (BID) | INTRAMUSCULAR | Status: DC
  Administered 2012-05-04 – 2012-05-06 (×3): 3 mL via INTRAVENOUS

## 2012-05-04 MED ORDER — MORPHINE SULFATE (PF) 1 MG/ML IV SOLN
INTRAVENOUS | Status: AC
  Filled 2012-05-04: qty 25

## 2012-05-04 MED ORDER — BUPIVACAINE LIPOSOME 1.3 % IJ SUSP
INTRAMUSCULAR | Status: DC | PRN
  Administered 2012-05-04: 20 mL

## 2012-05-04 MED ORDER — FENTANYL CITRATE 0.05 MG/ML IJ SOLN
INTRAMUSCULAR | Status: DC | PRN
  Administered 2012-05-04: 100 ug via INTRAVENOUS
  Administered 2012-05-04: 50 ug via INTRAVENOUS
  Administered 2012-05-04: 250 ug via INTRAVENOUS
  Administered 2012-05-04: 100 ug via INTRAVENOUS

## 2012-05-04 MED ORDER — BUPIVACAINE LIPOSOME 1.3 % IJ SUSP
20.0000 mL | Freq: Once | INTRAMUSCULAR | Status: DC
  Filled 2012-05-04: qty 20

## 2012-05-04 MED ORDER — ONDANSETRON HCL 4 MG/2ML IJ SOLN: 4.0000 mg | INTRAMUSCULAR | Status: DC | PRN

## 2012-05-04 MED ORDER — ZOLPIDEM TARTRATE 5 MG PO TABS
10.0000 mg | ORAL_TABLET | Freq: Every evening | ORAL | Status: DC | PRN
  Administered 2012-05-06: 10 mg via ORAL
  Filled 2012-05-04: qty 2

## 2012-05-04 MED ORDER — OXYCODONE-ACETAMINOPHEN 5-325 MG PO TABS
1.0000 | ORAL_TABLET | ORAL | Status: DC | PRN
  Administered 2012-05-05: 2 via ORAL
  Filled 2012-05-04: qty 2

## 2012-05-04 MED ORDER — ONDANSETRON HCL 4 MG/2ML IJ SOLN
INTRAMUSCULAR | Status: DC | PRN
  Administered 2012-05-04: 4 mg via INTRAVENOUS

## 2012-05-04 MED ORDER — SODIUM CHLORIDE 0.9 % IJ SOLN: 3.0000 mL | INTRAMUSCULAR | Status: DC | PRN

## 2012-05-04 MED ORDER — OXYCODONE HCL 5 MG PO TABS
5.0000 mg | ORAL_TABLET | Freq: Once | ORAL | Status: AC | PRN
  Administered 2012-05-04: 5 mg via ORAL

## 2012-05-04 MED ORDER — GLYCOPYRROLATE 0.2 MG/ML IJ SOLN
INTRAMUSCULAR | Status: DC | PRN
  Administered 2012-05-04: 0.6 mg via INTRAVENOUS

## 2012-05-04 MED ORDER — DEXTROSE 5 % IV SOLN: INTRAVENOUS | Status: DC | PRN

## 2012-05-04 MED ORDER — PHENYLEPHRINE HCL 10 MG/ML IJ SOLN
INTRAMUSCULAR | Status: DC | PRN
  Administered 2012-05-04 (×4): 80 ug via INTRAVENOUS
  Administered 2012-05-04: 40 ug via INTRAVENOUS
  Administered 2012-05-04 (×6): 80 ug via INTRAVENOUS
  Administered 2012-05-04: 120 ug via INTRAVENOUS

## 2012-05-04 MED ORDER — NEOSTIGMINE METHYLSULFATE 1 MG/ML IJ SOLN
INTRAMUSCULAR | Status: DC | PRN
  Administered 2012-05-04: 1 mg via INTRAVENOUS
  Administered 2012-05-04: 4 mg via INTRAVENOUS

## 2012-05-04 MED ORDER — DIAZEPAM 5 MG PO TABS
5.0000 mg | ORAL_TABLET | Freq: Four times a day (QID) | ORAL | Status: DC | PRN
  Administered 2012-05-05: 5 mg via ORAL
  Filled 2012-05-04: qty 1

## 2012-05-04 MED ORDER — OXYCODONE HCL 5 MG PO TABS
ORAL_TABLET | ORAL | Status: AC
  Filled 2012-05-04: qty 1

## 2012-05-04 MED ORDER — OXYCODONE HCL 5 MG/5ML PO SOLN: 5.0000 mg | Freq: Once | ORAL | Status: AC | PRN

## 2012-05-04 MED ORDER — MORPHINE SULFATE (PF) 1 MG/ML IV SOLN
INTRAVENOUS | Status: DC
  Administered 2012-05-04: 21:00:00 via INTRAVENOUS
  Administered 2012-05-05: 10.5 mg via INTRAVENOUS
  Administered 2012-05-05: 13.46 mg via INTRAVENOUS
  Filled 2012-05-04: qty 25

## 2012-05-04 MED ORDER — FENTANYL CITRATE 0.05 MG/ML IJ SOLN
INTRAMUSCULAR | Status: AC
  Filled 2012-05-04: qty 2

## 2012-05-04 MED ORDER — SODIUM CHLORIDE 0.9 % IV SOLN: 250.0000 mL | INTRAVENOUS | Status: DC

## 2012-05-04 MED ORDER — PROPOFOL 10 MG/ML IV BOLUS
INTRAVENOUS | Status: DC | PRN
  Administered 2012-05-04: 200 mg via INTRAVENOUS
  Administered 2012-05-04: 20 mg via INTRAVENOUS

## 2012-05-04 MED ORDER — PHENOL 1.4 % MT LIQD: 1.0000 | OROMUCOSAL | Status: DC | PRN

## 2012-05-04 MED ORDER — ACETAMINOPHEN 650 MG RE SUPP: 650.0000 mg | RECTAL | Status: DC | PRN

## 2012-05-04 MED ORDER — THROMBIN 5000 UNITS EX SOLR
CUTANEOUS | Status: DC | PRN
  Administered 2012-05-04 (×2): 5000 [IU] via TOPICAL

## 2012-05-04 MED ORDER — NALOXONE HCL 0.4 MG/ML IJ SOLN: 0.4000 mg | INTRAMUSCULAR | Status: DC | PRN

## 2012-05-04 MED ORDER — EPHEDRINE SULFATE 50 MG/ML IJ SOLN
INTRAMUSCULAR | Status: DC | PRN
  Administered 2012-05-04: 5 mg via INTRAVENOUS

## 2012-05-04 MED ORDER — DIPHENHYDRAMINE HCL 50 MG/ML IJ SOLN: 12.5000 mg | Freq: Four times a day (QID) | INTRAMUSCULAR | Status: DC | PRN

## 2012-05-04 MED ORDER — METHYLPREDNISOLONE ACETATE 80 MG/ML IJ SUSP
INTRAMUSCULAR | Status: DC | PRN
  Administered 2012-05-04: 80 mg via INTRALESIONAL

## 2012-05-04 MED ORDER — DIAZEPAM 5 MG/ML IJ SOLN
INTRAMUSCULAR | Status: AC
  Filled 2012-05-04: qty 2

## 2012-05-04 MED ORDER — HYDROMORPHONE HCL PF 1 MG/ML IJ SOLN
0.2500 mg | INTRAMUSCULAR | Status: DC | PRN
  Administered 2012-05-04 (×4): 0.5 mg via INTRAVENOUS

## 2012-05-04 MED ORDER — LACTATED RINGERS IV SOLN
INTRAVENOUS | Status: DC | PRN
  Administered 2012-05-04 (×3): via INTRAVENOUS

## 2012-05-04 MED ORDER — ONDANSETRON HCL 4 MG/2ML IJ SOLN: 4.0000 mg | Freq: Four times a day (QID) | INTRAMUSCULAR | Status: DC | PRN

## 2012-05-04 MED ORDER — LIDOCAINE HCL (CARDIAC) 20 MG/ML IV SOLN
INTRAVENOUS | Status: DC | PRN
  Administered 2012-05-04: 45 mg via INTRAVENOUS

## 2012-05-04 MED ORDER — SODIUM CHLORIDE 0.9 % IV SOLN
INTRAVENOUS | Status: DC
  Administered 2012-05-04: 22:00:00 via INTRAVENOUS

## 2012-05-04 MED ORDER — ACETAMINOPHEN 325 MG PO TABS: 650.0000 mg | ORAL_TABLET | ORAL | Status: DC | PRN

## 2012-05-04 MED ORDER — SENNA 8.6 MG PO TABS
1.0000 | ORAL_TABLET | Freq: Two times a day (BID) | ORAL | Status: DC
  Administered 2012-05-05 – 2012-05-06 (×2): 8.6 mg via ORAL
  Filled 2012-05-04 (×4): qty 1

## 2012-05-04 MED ORDER — ALBUMIN HUMAN 5 % IV SOLN
INTRAVENOUS | Status: DC | PRN
  Administered 2012-05-04 (×2): via INTRAVENOUS

## 2012-05-04 MED ORDER — DIAZEPAM 5 MG/ML IJ SOLN
2.0000 mg | Freq: Once | INTRAMUSCULAR | Status: AC
  Administered 2012-05-04: 2 mg via INTRAVENOUS

## 2012-05-04 MED ORDER — MENTHOL 3 MG MT LOZG: 1.0000 | LOZENGE | OROMUCOSAL | Status: DC | PRN

## 2012-05-04 SURGICAL SUPPLY — 56 items
BENZOIN TINCTURE PRP APPL 2/3 (GAUZE/BANDAGES/DRESSINGS) ×2 IMPLANT
BLADE SURG ROTATE 9660 (MISCELLANEOUS) IMPLANT
BUR ACORN 6.0 (BURR) ×2 IMPLANT
BUR MATCHSTICK NEURO 3.0 LAGG (BURR) ×2 IMPLANT
CANISTER SUCTION 2500CC (MISCELLANEOUS) ×2 IMPLANT
CLOTH BEACON ORANGE TIMEOUT ST (SAFETY) ×2 IMPLANT
CONT SPEC 4OZ CLIKSEAL STRL BL (MISCELLANEOUS) ×2 IMPLANT
DERMABOND ADVANCED (GAUZE/BANDAGES/DRESSINGS) ×1
DERMABOND ADVANCED .7 DNX12 (GAUZE/BANDAGES/DRESSINGS) ×1 IMPLANT
DRAPE LAPAROTOMY 100X72X124 (DRAPES) ×2 IMPLANT
DRAPE MICROSCOPE LEICA (MISCELLANEOUS) ×2 IMPLANT
DRAPE POUCH INSTRU U-SHP 10X18 (DRAPES) ×2 IMPLANT
DRSG PAD ABDOMINAL 8X10 ST (GAUZE/BANDAGES/DRESSINGS) IMPLANT
DURAPREP 26ML APPLICATOR (WOUND CARE) ×2 IMPLANT
ELECT REM PT RETURN 9FT ADLT (ELECTROSURGICAL) ×2
ELECTRODE REM PT RTRN 9FT ADLT (ELECTROSURGICAL) ×1 IMPLANT
GAUZE SPONGE 4X4 16PLY XRAY LF (GAUZE/BANDAGES/DRESSINGS) ×2 IMPLANT
GLOVE BIO SURGEON STRL SZ 6.5 (GLOVE) ×2 IMPLANT
GLOVE BIO SURGEON STRL SZ8 (GLOVE) ×2 IMPLANT
GLOVE BIOGEL M 8.0 STRL (GLOVE) ×2 IMPLANT
GLOVE BIOGEL PI IND STRL 6.5 (GLOVE) ×1 IMPLANT
GLOVE BIOGEL PI IND STRL 8.5 (GLOVE) ×1 IMPLANT
GLOVE BIOGEL PI INDICATOR 6.5 (GLOVE) ×1
GLOVE BIOGEL PI INDICATOR 8.5 (GLOVE) ×1
GLOVE EXAM NITRILE LRG STRL (GLOVE) IMPLANT
GLOVE EXAM NITRILE MD LF STRL (GLOVE) ×2 IMPLANT
GLOVE EXAM NITRILE XL STR (GLOVE) IMPLANT
GLOVE EXAM NITRILE XS STR PU (GLOVE) IMPLANT
GOWN BRE IMP SLV AUR LG STRL (GOWN DISPOSABLE) ×2 IMPLANT
GOWN BRE IMP SLV AUR XL STRL (GOWN DISPOSABLE) IMPLANT
GOWN STRL REIN 2XL LVL4 (GOWN DISPOSABLE) IMPLANT
KIT BASIN OR (CUSTOM PROCEDURE TRAY) ×2 IMPLANT
KIT ROOM TURNOVER OR (KITS) ×2 IMPLANT
NEEDLE HYPO 18GX1.5 BLUNT FILL (NEEDLE) IMPLANT
NEEDLE HYPO 21X1.5 SAFETY (NEEDLE) IMPLANT
NEEDLE HYPO 25X1 1.5 SAFETY (NEEDLE) IMPLANT
NEEDLE SPNL 20GX3.5 QUINCKE YW (NEEDLE) IMPLANT
NS IRRIG 1000ML POUR BTL (IV SOLUTION) ×2 IMPLANT
PACK LAMINECTOMY NEURO (CUSTOM PROCEDURE TRAY) ×2 IMPLANT
PAD ARMBOARD 7.5X6 YLW CONV (MISCELLANEOUS) ×6 IMPLANT
PATTIES SURGICAL .5 X1 (DISPOSABLE) ×2 IMPLANT
RUBBERBAND STERILE (MISCELLANEOUS) ×4 IMPLANT
SPONGE GAUZE 4X4 12PLY (GAUZE/BANDAGES/DRESSINGS) ×2 IMPLANT
SPONGE LAP 4X18 X RAY DECT (DISPOSABLE) IMPLANT
SPONGE SURGIFOAM ABS GEL SZ50 (HEMOSTASIS) ×2 IMPLANT
STRIP CLOSURE SKIN 1/2X4 (GAUZE/BANDAGES/DRESSINGS) ×2 IMPLANT
SUT VIC AB 0 CT1 18XCR BRD8 (SUTURE) ×1 IMPLANT
SUT VIC AB 0 CT1 8-18 (SUTURE) ×1
SUT VIC AB 2-0 CP2 18 (SUTURE) ×2 IMPLANT
SUT VIC AB 3-0 SH 8-18 (SUTURE) ×2 IMPLANT
SYR 20CC LL (SYRINGE) IMPLANT
SYR 20ML ECCENTRIC (SYRINGE) ×2 IMPLANT
SYR 5ML LL (SYRINGE) IMPLANT
TOWEL OR 17X24 6PK STRL BLUE (TOWEL DISPOSABLE) ×2 IMPLANT
TOWEL OR 17X26 10 PK STRL BLUE (TOWEL DISPOSABLE) ×2 IMPLANT
WATER STERILE IRR 1000ML POUR (IV SOLUTION) ×2 IMPLANT

## 2012-05-04 NOTE — Preoperative (Signed)
Beta Blockers   Reason not to administer Beta Blockers:Not Applicable 

## 2012-05-04 NOTE — H&P (Signed)
Chris Hunt is an 47 y.o. male.   Chief Complaint: lbp HPI: patient seen with lower back pain with radiation to the right leg to the knee and LEFT leg pain with radiation to the left foot which has been going on for several years but worse for the past 3 weeks.    Past Medical History  Diagnosis Date  . Tobacco abuse   . GERD (gastroesophageal reflux disease)   . Allergy     Allergic rhinitis    Past Surgical History  Procedure Laterality Date  . Esophagus surgery  84    " holes in esophagus"    History reviewed. No pertinent family history. Social History:  reports that he has been smoking Cigarettes.  He has been smoking about 1.00 pack per day. He does not have any smokeless tobacco history on file. He reports that he drinks about 7.2 ounces of alcohol per week. He reports that he does not use illicit drugs.  Allergies:  Allergies  Allergen Reactions  . Shellfish Allergy Anaphylaxis    Medications Prior to Admission  Medication Sig Dispense Refill  . diazepam (VALIUM) 5 MG tablet Take 5 mg by mouth every 6 (six) hours as needed for anxiety.      Marland Kitchen omeprazole (PRILOSEC) 20 MG capsule Take 20 mg by mouth 2 (two) times daily.      Marland Kitchen oxyCODONE (ROXICODONE) 15 MG immediate release tablet Take 15 mg by mouth every 4 (four) hours as needed for pain.        No results found for this or any previous visit (from the past 48 hour(s)). No results found.  Review of Systems  Constitutional: Negative.   Eyes: Negative.   Respiratory: Negative.   Cardiovascular: Negative.   Genitourinary: Negative.   Musculoskeletal: Positive for back pain.  Skin: Negative.   Neurological: Positive for sensory change, focal weakness and headaches.  Endo/Heme/Allergies: Negative.   Psychiatric/Behavioral: Negative.     Blood pressure 122/86, pulse 66, temperature 97.1 F (36.2 C), temperature source Oral, resp. rate 20, SpO2 96.00%. Physical Exam hent, nl, neck, nl. Cv, nl. Lungs, clear.  Abdomen soft. Extremities, nl. NEURO WEAKNESS OF DF OF left FOOT, RIGHT WEAKNESS OF PF. DTR ABSENT ANKLE REFLEXES.  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 Patient aware of risks and benefits  BOTERO,ERNESTO M 05/04/2012, 5:03 PM

## 2012-05-04 NOTE — Progress Notes (Signed)
Pt states his pain is slowly going down. Called [facility] in Neuro OR and they will be sending in approximately 1 hour. Pt informed of this and asked to call us if he needs anything in the meantime.

## 2012-05-04 NOTE — Anesthesia Preprocedure Evaluation (Signed)
Anesthesia Evaluation  Patient identified by MRN, date of birth, ID band Patient awake    Reviewed: Allergy & Precautions, H&P , NPO status , Patient's Chart, lab work & pertinent test results  Airway Mallampati: II TM Distance: >3 FB Neck ROM: Full    Dental no notable dental hx. (+) Teeth Intact and Dental Advisory Given   Pulmonary Current Smoker,  breath sounds clear to auscultation  Pulmonary exam normal       Cardiovascular negative cardio ROS  Rhythm:Regular Rate:Normal     Neuro/Psych PSYCHIATRIC DISORDERS  Neuromuscular disease    GI/Hepatic Neg liver ROS, GERD-  Medicated and Controlled,  Endo/Other  negative endocrine ROS  Renal/GU negative Renal ROS  negative genitourinary   Musculoskeletal   Abdominal   Peds  Hematology negative hematology ROS (+)   Anesthesia Other Findings   Reproductive/Obstetrics negative OB ROS                           Anesthesia Physical Anesthesia Plan  ASA: II  Anesthesia Plan: General   Post-op Pain Management:    Induction: Intravenous  Airway Management Planned: Oral ETT  Additional Equipment:   Intra-op Plan:   Post-operative Plan: Extubation in OR  Informed Consent: I have reviewed the patients History and Physical, chart, labs and discussed the procedure including the risks, benefits and alternatives for the proposed anesthesia with the patient or authorized representative who has indicated his/her understanding and acceptance.   Dental advisory given  Plan Discussed with: CRNA  Anesthesia Plan Comments:         Anesthesia Quick Evaluation

## 2012-05-04 NOTE — Progress Notes (Signed)
Notified Dr. Cora Collum of patient having pain, stated was okay for patient to take home dose 1 table of oxycod IR.

## 2012-05-04 NOTE — Anesthesia Postprocedure Evaluation (Signed)
Anesthesia Post Note  Patient: Chris Hunt  Procedure(s) Performed: Procedure(s) (LRB): LUMBAR LAMINECTOMY/DECOMPRESSION MICRODISCECTOMY 2 LEVELS (N/A)  Anesthesia type: general  Patient location: PACU  Post pain: Pain level controlled  Post assessment: Patient's Cardiovascular Status Stable  Last Vitals:  Filed Vitals:   05/04/12 2038  BP: 122/87  Pulse: 90  Temp:   Resp: 16    Post vital signs: Reviewed and stable  Level of consciousness: sedated  Complications: No apparent anesthesia complications

## 2012-05-04 NOTE — Progress Notes (Signed)
Op note [phone]

## 2012-05-04 NOTE — Transfer of Care (Signed)
Immediate Anesthesia Transfer of Care Note  Patient: Chris Hunt  Procedure(s) Performed: Procedure(s) with comments: LUMBAR LAMINECTOMY/DECOMPRESSION MICRODISCECTOMY 2 LEVELS (N/A) - Bilateral Lumbar Five-Sacral One Diskectomy/Left Lumbar Four-Five Foraminotomy   Patient Location: PACU  Anesthesia Type:General  Level of Consciousness: awake and patient cooperative  Airway & Oxygen Therapy: Patient Spontanous Breathing and Patient connected to face mask oxygen  Post-op Assessment: Report given to PACU RN, Post -op Vital signs reviewed and stable and Patient moving all extremities X 4  Post vital signs: Reviewed and stable  Complications: No apparent anesthesia complications

## 2012-05-05 ENCOUNTER — Encounter (HOSPITAL_COMMUNITY): Payer: Self-pay | Admitting: *Deleted

## 2012-05-05 MED ORDER — OXYCODONE HCL 5 MG PO TABS
20.0000 mg | ORAL_TABLET | Freq: Four times a day (QID) | ORAL | Status: DC | PRN
  Administered 2012-05-05 – 2012-05-06 (×3): 20 mg via ORAL
  Filled 2012-05-05 (×3): qty 4

## 2012-05-05 NOTE — Progress Notes (Addendum)
Physical Therapy Evaluation Patient Details Name: Chris Hunt MRN: [id number] DOB: 1965/04/25 Today's Date: 05/05/2012 Time:  7829- 0851    PT Assessment / Plan / Recommendation Clinical Impression  Pt is a 47 y.o. male s/p back surgery. Patient demonstrates deficits in functional mobility secondary to pain.  Anticipate patient will progress well.  Pt educated on precautions and techniques for mobility.  Encouraged OOB for meals and ambulation with nsg. Will continue to see as indicated.    PT Assessment  Patient needs continued PT services    Follow Up Recommendations  No PT follow up;Supervision - Intermittent    Does the patient have the potential to tolerate intense rehabilitation      Barriers to Discharge None      Equipment Recommendations  None recommended by PT    Recommendations for Other Services     Frequency Min 5X/week    Precautions / Restrictions Precautions Precautions: Back Precaution Comments: Educated pt on 3/3 back precautions. Restrictions Weight Bearing Restrictions: No   Pertinent Vitals/Pain 8/10 (just medicated)      Mobility  Bed Mobility Bed Mobility: Rolling Left;Left Sidelying to Sit;Sitting - Scoot to Edge of Bed Rolling Left: 4: Min assist;With rail Left Sidelying to Sit: 4: Min assist;HOB flat;With rails Sitting - Scoot to Edge of Bed: 5: Supervision Details for Bed Mobility Assistance: Min assist for hand placement and supporting trunk transition OOB. VC for sequencing. Transfers Transfers: Sit to Stand;Stand to Sit Sit to Stand: 4: Min assist;From bed;With upper extremity assist Stand to Sit: 4: Min guard;To chair/3-in-1;With armrests;With upper extremity assist Details for Transfer Assistance: Increased time due to pain. VCs for safe hand placement. Ambulation/Gait Ambulation/Gait Assistance: 4: Min guard Ambulation Distance (Feet): 80 Feet Assistive device: Other (Comment) (IV pole) Gait Pattern: Step-through  pattern;Decreased stride length Gait velocity: decreased General Gait Details: patient rigid and slow with gait secondary to pain Stairs: No    Exercises     PT Diagnosis: Difficulty walking;Acute pain  PT Problem List: Decreased range of motion;Decreased activity tolerance;Decreased mobility;Pain PT Treatment Interventions: Gait training;Stair training;Functional mobility training;Therapeutic activities;Therapeutic exercise;Patient/family education   PT Goals Acute Rehab PT Goals PT Goal Formulation: With patient/family Time For Goal Achievement: 05/12/12 Potential to Achieve Goals: Good Pt will Roll Supine to Left Side: Independently PT Goal: Rolling Supine to Left Side - Progress: Goal set today Pt will go Supine/Side to Sit: Independently PT Goal: Supine/Side to Sit - Progress: Goal set today Pt will go Sit to Supine/Side: Independently PT Goal: Sit to Supine/Side - Progress: Goal set today Pt will go Sit to Stand: Independently PT Goal: Sit to Stand - Progress: Goal set today Pt will go Stand to Sit: Independently PT Goal: Stand to Sit - Progress: Goal set today Pt will Ambulate: >150 feet;with supervision PT Goal: Ambulate - Progress: Goal set today Pt will Go Up / Down Stairs: 3-5 stairs;with min assist PT Goal: Up/Down Stairs - Progress: Goal set today  Visit Information  Last PT Received On: 05/05/12 Assistance Needed: +1    Subjective Data      Prior Functioning  Home Living Lives With: Family Available Help at Discharge: Friend(s);Available 24 hours/day Type of Home: House Home Access: Stairs to enter [employer] of Steps: 4 Entrance Stairs-Rails: Left Home Layout: One level Bathroom Shower/Tub: Engineer, manufacturing systems: Standard Home Adaptive Equipment: None Prior Function Level of Independence: Independent Able to Take Stairs?: Yes Driving: Yes Communication Communication: No difficulties    Cognition  Cognition  Overall  Cognitive Status: Appears within functional limits for tasks assessed/performed Arousal/Alertness: Awake/alert Orientation Level: Appears intact for tasks assessed Behavior During Session: [facility] for tasks performed    Extremity/Trunk Assessment Right Upper Extremity Assessment RUE ROM/Strength/Tone: [facility] for tasks assessed Left Upper Extremity Assessment LUE ROM/Strength/Tone: WFL for tasks assessed   Balance Balance Balance Assessed: Yes High Level Balance High Level Balance Activites: Side stepping;Backward walking;Direction changes;Turns;Sudden stops;Head turns High Level Balance Comments:  (min guard and increased time secondary to pain)  End of Session PT - End of Session Equipment Utilized During Treatment: Gait belt Activity Tolerance: Patient limited by pain Patient left: in chair;with call bell/phone within reach;with family/visitor present Nurse Communication: Mobility status  GP     Fabio Asa 05/05/2012, 10:31 AM Charlotte Crumb, PT DPT  [phone]

## 2012-05-05 NOTE — Progress Notes (Signed)
Pt. Requesting to go to ICU to visit his father, sates he had throat surgery a couple of days ago for CA.  Explained that probabbly not tonight, but would have to check with doctor and get an ok,  Charge nurse notified.

## 2012-05-05 NOTE — Progress Notes (Signed)
Patient ID: Chris Hunt, male   DOB: 10-27-1965, 47 y.o.   MRN: [id number] C/o incisional [pain as expected. No leg pain or weakness. Was ambulating with help.

## 2012-05-05 NOTE — Plan of Care (Signed)
Problem: Consults Goal: Diagnosis - Spinal Surgery Lumbar Laminectomy (Complex)     

## 2012-05-05 NOTE — Evaluation (Signed)
Occupational Therapy Evaluation Patient Details Name: Chris Hunt MRN: [id number] DOB: [birth year] Today's Date: 05/05/2012 Time: 3086-5784 OT Time Calculation (min): 23 min  OT Assessment / Plan / Recommendation Clinical Impression  Pt s/p lumbar lami/ decompression microdiscectomy 2 level.  Will benefit from continued OT services to address below problem list in prep for return home with family.    OT Assessment  Patient needs continued OT Services    Follow Up Recommendations  No OT follow up;Supervision - Intermittent    Barriers to Discharge None    Equipment Recommendations   (tbd)    Recommendations for Other Services    Frequency  Min 2X/week    Precautions / Restrictions Precautions Precautions: Back Precaution Comments: Educated pt on 3/3 back precautions.   Pertinent Vitals/Pain See vitals    ADL  Eating/Feeding: Performed;Independent Where Assessed - Eating/Feeding: Bed level Upper Body Bathing: Simulated;Minimal assistance Where Assessed - Upper Body Bathing: Unsupported sitting Lower Body Bathing: Simulated;Moderate assistance Where Assessed - Lower Body Bathing: Supported sit to stand Upper Body Dressing: Performed;Minimal assistance Where Assessed - Upper Body Dressing: Unsupported sitting Lower Body Dressing: Simulated;Moderate assistance Where Assessed - Lower Body Dressing: Supported sit to stand Toilet Transfer: Mining engineer Method: Sit to Barista:  (bed to ambulate in hall then return to chair in room) Equipment Used: Gait belt Transfers/Ambulation Related to ADLs: Min guard while pushing IV pole for ambulation in room and hall. ADL Comments: Pt limited by pain and fatigue. Educated pt and family on back precautions. Pt verbalized understanding.    OT Diagnosis: Acute pain;Generalized weakness  OT Problem List: Decreased activity tolerance;Decreased strength;Decreased knowledge of  precautions;Pain OT Treatment Interventions: Self-care/ADL training;DME and/or AE instruction;Therapeutic activities;Patient/family education   OT Goals Acute Rehab OT Goals OT Goal Formulation: With patient Time For Goal Achievement: 05/12/12 Potential to Achieve Goals: Good ADL Goals Pt Will Perform Grooming: with supervision;Standing at sink ADL Goal: Grooming - Progress: Goal set today Pt Will Perform Lower Body Bathing: with min assist;Sit to stand from chair;Sit to stand from bed;Other (comment) (caregiver independent in assisting) ADL Goal: Lower Body Bathing - Progress: Goal set today Pt Will Perform Lower Body Dressing: with min assist;Sit to stand from chair;Sit to stand from bed;Other (comment) (caregiver independent in assisting) ADL Goal: Lower Body Dressing - Progress: Goal set today Pt Will Transfer to Toilet: with supervision;Ambulation;Regular height toilet;Maintaining back safety precautions ADL Goal: Toilet Transfer - Progress: Goal set today  Visit Information  Last OT Received On: 05/05/12 Assistance Needed: +1 PT/OT Co-Evaluation/Treatment: Yes    Subjective Data  Subjective: "I want to get up" Patient Stated Goal: return home   Prior Functioning     Home Living Lives With: Family Available Help at Discharge: Friend(s);Available 24 hours/day Type of Home: House Home Access: Stairs to enter [employer] of Steps: 4 Entrance Stairs-Rails: Left Home Layout: One level Bathroom Shower/Tub: Engineer, manufacturing systems: Standard Home Adaptive Equipment: None Prior Function Level of Independence: Independent Able to Take Stairs?: Yes Driving: Yes Communication Communication: No difficulties         Vision/Perception     Cognition  Cognition Overall Cognitive Status: Appears within functional limits for tasks assessed/performed Arousal/Alertness: Awake/alert Orientation Level: Appears intact for tasks assessed Behavior During  Session: [name] for tasks performed    Extremity/Trunk Assessment Right Upper Extremity Assessment RUE ROM/Strength/Tone: [name] for tasks assessed Left Upper Extremity Assessment LUE ROM/Strength/Tone: [name] for tasks assessed     Mobility Bed  Mobility Bed Mobility: Rolling Left;Left Sidelying to Sit;Sitting - Scoot to Edge of Bed Rolling Left: 4: Min assist;With rail Left Sidelying to Sit: 4: Min assist;HOB flat;With rails Sitting - Scoot to Edge of Bed: 5: Supervision Details for Bed Mobility Assistance: Min assist for hand placement and supporting trunk transition OOB. VC for sequencing. Transfers Transfers: Sit to Stand;Stand to Sit Sit to Stand: 4: Min assist;From bed;With upper extremity assist Stand to Sit: 4: Min guard;To chair/3-in-1;With armrests;With upper extremity assist Details for Transfer Assistance: Increased time due to pain. VCs for safe hand placement.     Exercise     Balance     End of Session OT - End of Session Equipment Utilized During Treatment: Gait belt Activity Tolerance: Patient tolerated treatment well Patient left: in chair;with call bell/phone within reach;with family/visitor present Nurse Communication: Mobility status  GO    05/05/2012 Chris Hunt OTR/L Pager [phone] Office [phone]  Chris Hunt, Chris Hunt 05/05/2012, 9:03 AM

## 2012-05-05 NOTE — Op Note (Signed)
NAMEMarland Hunt  [place], ZAPIEN NO.:  [id number]  MEDICAL RECORD NO.:  [id number]  LOCATION:  [id number]                        FACILITY:  [facility]  PHYSICIAN:  Hilda Lias, M.D.   DATE OF BIRTH:  [birth year]  DATE OF PROCEDURE:  05/05/2012 DATE OF DISCHARGE:                              OPERATIVE REPORT   PREOPERATIVE DIAGNOSIS:  Chronic lumbar pain, degenerative disk disease L5-S1 with bilateral herniated disk and left L4-L5 extraforaminal disk.  POSTOPERATIVE DIAGNOSIS:  Chronic lumbar pain, degenerative disk disease L5-S1 with bilateral herniated disk and left L4-L5 extraforaminal disk.  PROCEDURE:  Right L5-S1 laminotomy and foraminotomy, removal of calcified piece of disk compromising the right S1 nerve root, left L5 hemilaminectomy, removal of calcified disk compromising the left S1 nerve root, decompression of the L4 and L5 nerve root, and microscope.  SURGEON:  Hilda Lias, M.D.  ASSISTANT:  Venetia Maxon  CLINICAL HISTORY:  Mr. Chris Hunt is a 47 year old gentleman who had been complaining of back pain for at least 2 years.  About 4 weeks ago, the pain got worse with radiation bilaterally, the left worse than the right.  He has failed with conservative treatment.  MRI shows severe degenerative disk disease at L5-1 with compromise bilaterally and laterally, and extraforaminal disk at L4-5 to the left.  Surgery was advised.  He and his wife were aware of the MRI and the risk of the surgery.  DESCRIPTION OF THE PROCEDURE:  The patient was taken to the OR and after intubation, he was positioned on prone manner.  The back was cleaned with DuraPrep.  Drapes were applied.  Midline incision was done from the L4-5 down to L5-S1.  Muscle was retracted laterally.  We did two x-ray which showed that indeed the tip was at the level L4-5.  Then, we started at the level of L5-1 in the left.  We did remove the of the lamina of upper S1 and because of the finding of the x-ray we  went ahead, and we did removal of the hemi-lamina of L5.  We identified the L5-S1 space which was quite difficult because the nerve root was attached to the floor.  Microdissection with the help of the microscope was done and the S1 nerve root on the left side was moved medially. Indeed, there was a large central calcified disk without piece of herniated disk and endplate compromising right the axilla of the S1 nerve root.  We tried to entered the disk space at L5-S1, but it was impossible because of quite a bit of narrowing.  With the micro hook, we felt the midline and indeed there was large calcified disk, but no more fragments.  Then our attention was at the level of L4-5.  We did a foraminotomy to decompress the L4 and L5 nerve root.  We used hockey stick hook and we were able to go all the way laterally with plenty of space for the L4-L5 nerve root.  Because of that, we decided not to go for the foramina.  Then in the right side, we did a laminotomy of L5-1 and we found essentially the similar findings.  The nerve was fully adherent and at this level was a calcified disk  compromising the takeoff S1.  Removal was done of the calcified disk. We feel the midline and indeed there was no more fragments.  At the end, we had a good decompression.  Valsalva maneuver was negative.  Fentanyl and Depo-Medrol were left in the epidural space and the wound was closed with Vicryl and Steri-Strips.          ______________________________ Hilda Lias, M.D.     EB/MEDQ  D:  05/04/2012  T:  05/05/2012  Job:  [id number]  cc:   Venetia Maxon

## 2012-05-06 MED ORDER — OXYCODONE HCL 20 MG PO TABS: 15.0000 mg | ORAL_TABLET | ORAL | Status: DC | PRN

## 2012-05-06 NOTE — Clinical Social Work Note (Signed)
CSW consult for SNF. PT with no f/u PT needs noted. CSW signing off as no other CSW needs identified at this time. Please re-consult if CSW needs arise.  Dellie Burns, MSW, LCSWA [phone] (Weekends 8:00am-4:30pm)

## 2012-05-06 NOTE — Discharge Summary (Signed)
Physician Discharge Summary  Patient ID: OWEN PRATTE MRN: [id number] DOB/AGE: 46-May-1967 45 y.o.  Admit date: 05/04/2012 Discharge date: 05/06/2012  Admission Diagnoses:Herniated lumbar disc and lumbar radiculopathy bilateral L5/S1 and left L4/5 extraforaminal.  Discharge Diagnoses: Herniated lumbar disc and lumbar radiculopathy bilateral L5/S1 and left L4/5 extraforaminal Active Problems:   * No active hospital problems. *   Discharged Condition: good  Hospital Course: Underwent  bilateral L5/S1 and left L4/5 extraforaminal discectomy and did well post-operatively.  Consults: None  Significant Diagnostic Studies: None  Treatments: surgery: bilateral L5/S1 and left L4/5 extraforaminal discectomy  Discharge Exam: Blood pressure 117/64, pulse 78, temperature 98 F (36.7 C), temperature source Oral, resp. rate 20, height 6\' 1"  (1.854 m), weight 102.2 kg (225 lb 5 oz), SpO2 97.00%. Neurologic: Alert and oriented X 3, normal strength and tone. Normal symmetric reflexes. Normal coordination and gait Wound:CDI  Disposition: Home  Discharge Orders   Future Orders Complete By Expires     Walker rolling  As directed         Medication List    TAKE these medications       diazepam 5 MG tablet  Commonly known as:  VALIUM  Take 5 mg by mouth every 6 (six) hours as needed for anxiety.     omeprazole 20 MG capsule  Commonly known as:  PRILOSEC  Take 20 mg by mouth 2 (two) times daily.     Oxycodone HCl 20 MG Tabs  Take 0.75 tablets (15 mg total) by mouth every 4 (four) hours as needed for pain.     oxyCODONE 15 MG immediate release tablet  Commonly known as:  ROXICODONE  Take 15 mg by mouth every 4 (four) hours as needed for pain.         Signed: Dorian Heckle, MD 05/06/2012, 11:05 AM

## 2012-05-06 NOTE — Progress Notes (Signed)
Subjective: Patient reports feeling OK. Wants to go home.  Objective: Vital signs in last 24 hours: Temp:  [98 F (36.7 C)-98.7 F (37.1 C)] 98 F (36.7 C) (03/09 0337) Pulse Rate:  [65-84] 75 (03/09 0337) Resp:  [16-21] 20 (03/09 0337) BP: (106-131)/(69-86) 122/77 mmHg (03/09 0337) SpO2:  [95 %-100 %] 99 % (03/09 0337)  Intake/Output from previous day: 03/08 0701 - 03/09 0700 In: 1080 [P.O.:1080] Out: -  Intake/Output this shift:    Physical Exam: Dressing with drainage.  Will change.  Minimal leg pain, feels legs are strong.  Lab Results: No results found for this basename: WBC, HGB, HCT, PLT,  in the last 72 hours BMET No results found for this basename: NA, K, CL, CO2, GLUCOSE, BUN, CREATININE, CALCIUM,  in the last 72 hours  Studies/Results: Dg Lumbar Spine 2-3 Views  05/04/2012  *RADIOLOGY REPORT*  Clinical Data: Lumbar microdiskectomy  LUMBAR SPINE - 2-3 VIEW  Comparison: Preoperative radiographs 04/23/2012  Findings: Tissue spreaders positioned at the L4-L5 facet level.  A metallic probe is positioned over the inferior articulating process of L4.  Degenerative disc disease is noted, predominately at L5-S1. The second image demonstrates metallic probes at the lamina of L4, and L5.  IMPRESSION: Intraoperative spot images depict metallic probes positioned at the lamina of L4 and L5 as detailed above.   Original Report Authenticated By: Malachy Moan, M.D.     Assessment/Plan: D/C home.  Follow up with Dr. Jeral Fruit in 3 weeks in office.    LOS: 2 days    Dorian Heckle, MD 05/06/2012, 5:29 AM

## 2012-05-06 NOTE — Progress Notes (Signed)
Agree with PTA.    Megan Ritenour, PT [phone]  

## 2012-05-06 NOTE — Progress Notes (Signed)
Physical Therapy Treatment Patient Details Name: Chris Hunt MRN: [id number] DOB: January 17, 1966 Today's Date: 05/06/2012 Time: 4098-1191 PT Time Calculation (min): 24 min  PT Assessment / Plan / Recommendation Comments on Treatment Session  Pt moving well.  Eager to d/c home.   Pt opting to use RW at this time to ensure safety.  Pt will need RW at d/c.       Follow Up Recommendations  No PT follow up;Supervision - Intermittent     Does the patient have the potential to tolerate intense rehabilitation     Barriers to Discharge        Equipment Recommendations  Rolling walker with 5" wheels    Recommendations for Other Services    Frequency Min 5X/week   Plan Discharge plan remains appropriate    Precautions / Restrictions Precautions Precautions: Back Precaution Comments: Reviewed bakc precautions Restrictions Weight Bearing Restrictions: No   Pertinent Vitals/Pain 3/10 back.      Mobility  Bed Mobility Bed Mobility: Not assessed Rolling Left: 5: Supervision Left Sidelying to Sit: 5: Supervision;With rails Sitting - Scoot to Edge of Bed: 6: Modified independent (Device/Increase time) Transfers Transfers: Sit to Stand;Stand to Sit Sit to Stand: 6: Modified independent (Device/Increase time);With upper extremity assist;With armrests;From chair/3-in-1 Stand to Sit: 6: Modified independent (Device/Increase time);With armrests;With upper extremity assist;To chair/3-in-1 Details for Transfer Assistance: Increased time. No physical assist needed. Ambulation/Gait Ambulation/Gait Assistance: 5: Supervision Ambulation Distance (Feet): 220 Feet Assistive device: Rolling walker Gait Pattern: Step-through pattern;Decreased stride length Gait velocity: decreased General Gait Details: Cues to relax UE's, increased upright posture, & encouragement to increase gait speed.   Stairs: Yes Stairs Assistance: 4: Min guard Stair Management Technique: One rail Left;Alternating  pattern;Forwards Number of Stairs: 3 (2x's) Wheelchair Mobility Wheelchair Mobility: No     PT Goals Acute Rehab PT Goals Time For Goal Achievement: 05/12/12 Potential to Achieve Goals: Good Pt will Roll Supine to Left Side: Independently Pt will go Supine/Side to Sit: Independently Pt will go Sit to Supine/Side: Independently Pt will go Sit to Stand: Independently PT Goal: Sit to Stand - Progress: Progressing toward goal Pt will go Stand to Sit: Independently PT Goal: Stand to Sit - Progress: Progressing toward goal Pt will Ambulate: >150 feet;with supervision PT Goal: Ambulate - Progress: Met Pt will Go Up / Down Stairs: 3-5 stairs;with min assist PT Goal: Up/Down Stairs - Progress: Met  Visit Information  Last PT Received On: 05/06/12 Assistance Needed: +1    Subjective Data      Cognition  Cognition Overall Cognitive Status: Appears within functional limits for tasks assessed/performed Arousal/Alertness: Awake/alert Orientation Level: Appears intact for tasks assessed Behavior During Session: [facility] for tasks performed    Balance     End of Session PT - End of Session Activity Tolerance: Patient tolerated treatment well Patient left: in chair;with call bell/phone within reach;with family/visitor present Nurse Communication: Mobility status     Verdell Face, [place] [phone] 05/06/2012

## 2012-05-06 NOTE — Progress Notes (Signed)
   CARE MANAGEMENT NOTE 05/06/2012  Patient:  Chris Hunt, Chris Hunt   Account Number:  [id number]  Date Initiated:  05/06/2012  Documentation initiated by:  [facility]  Subjective/Objective Assessment:     Action/Plan:   Anticipated DC Date:  05/06/2012   Anticipated DC Plan:  HOME W HOME HEALTH SERVICES      DC Planning Services  CM consult      Choice offered to / List presented to:     DME arranged  Levan Hurst      DME agency  [facility]        Status of service:  Completed, signed off Medicare Important Message given?   (If response is "NO", the following Medicare IM given date fields will be blank) Date Medicare IM given:   Date Additional Medicare IM given:    Discharge Disposition:  HOME/SELF CARE  Per UR Regulation:    If discussed at Long Length of Stay Meetings, dates discussed:    Comments:  05/06/2012 1845 No NCM needs identified. Isidoro Donning RN CCM Case Mgmt phone [phone]

## 2012-05-06 NOTE — Progress Notes (Signed)
Occupational Therapy Treatment Patient Details Name: Chris Hunt MRN: [id number] DOB: 06-17-1965 Today's Date: 05/06/2012 Time: 4098-1191 OT Time Calculation (min): 18 min  OT Assessment / Plan / Recommendation Comments on Treatment Session Pt making good progress. All goals met, and education completed.  Will sign off at this time. Anticipate d/c home later today.    Follow Up Recommendations  No OT follow up;Supervision - Intermittent    Barriers to Discharge       Equipment Recommendations  None recommended by OT    Recommendations for Other Services    Frequency Min 2X/week   Plan Discharge plan remains appropriate;All goals met and education completed, patient discharged from OT services    Precautions / Restrictions Precautions Precautions: Back Precaution Comments: Pt able to recall 2/3 precautions (unable to recall "arching"). Restrictions Weight Bearing Restrictions: No   Pertinent Vitals/Pain See vitals    ADL  Grooming: Performed;Wash/dry hands;Supervision/safety Where Assessed - Grooming: Unsupported standing Lower Body Bathing: Simulated;Supervision/safety Where Assessed - Lower Body Bathing: Unsupported sit to stand Lower Body Dressing: Performed;Supervision/safety Where Assessed - Lower Body Dressing: Unsupported sit to stand Toilet Transfer: Performed;Supervision/safety Toilet Transfer Method: Sit to Barista: Comfort height toilet Toileting - Clothing Manipulation and Hygiene: Performed;Supervision/safety Where Assessed - Engineer, mining and Hygiene: Sit to stand from 3-in-1 or toilet Equipment Used:  (none) Transfers/Ambulation Related to ADLs: supervision in room without device. Pt reports he has been using RW for ambulation in hall for support. ADL Comments: Pt able to cross ankles over knees in order to perfrom LB tasks while maintaining back precautions.     OT Diagnosis:    OT Problem List:   OT  Treatment Interventions:     OT Goals ADL Goals Pt Will Perform Grooming: with supervision;Standing at sink ADL Goal: Grooming - Progress: Met Pt Will Perform Lower Body Bathing: with min assist;Sit to stand from chair;Sit to stand from bed;Other (comment) ADL Goal: Lower Body Bathing - Progress: Met Pt Will Perform Lower Body Dressing: with min assist;Sit to stand from chair;Sit to stand from bed;Other (comment) ADL Goal: Lower Body Dressing - Progress: Met Pt Will Transfer to Toilet: with supervision;Ambulation;Regular height toilet;Maintaining back safety precautions ADL Goal: Toilet Transfer - Progress: Met  Visit Information  Last OT Received On: 05/06/12 Assistance Needed: +1    Subjective Data      Prior Functioning       Cognition  Cognition Overall Cognitive Status: Appears within functional limits for tasks assessed/performed Arousal/Alertness: Awake/alert Orientation Level: Appears intact for tasks assessed Behavior During Session: [facility] for tasks performed    Mobility  Bed Mobility Bed Mobility: Rolling Left;Left Sidelying to Sit;Sitting - Scoot to Edge of Bed Rolling Left: 5: Supervision Left Sidelying to Sit: 5: Supervision;With rails Sitting - Scoot to Edge of Bed: 6: Modified independent (Device/Increase time) Transfers Transfers: Sit to Stand;Stand to Sit Sit to Stand: 5: Supervision;From bed;From toilet Stand to Sit: 5: Supervision;To bed;To toilet Details for Transfer Assistance: Increased time. No physical assist needed.    Exercises      Balance     End of Session OT - End of Session Activity Tolerance: Patient tolerated treatment well Patient left: in bed;with call bell/phone within reach;with family/visitor present  GO   05/06/2012 Cipriano Mile OTR/L Pager [phone] Office [phone]   Chris Hunt, Chris Hunt 05/06/2012, 11:26 AM

## 2012-05-06 NOTE — Progress Notes (Signed)
Ambulated pt around entire unit, standby assist with walker. C/o minimal pain and fatigue. Marked new drainage on gauze dressing. Assisted him back to bed, call bell within reach. Pt stating he wants to go home today, but if he is unable to go home he would like to be able to visit his father in the ICU. Informed him that I would check with the MD to see if he is ready for discharge upon rounds this AM.  Will continue to monitor. Allen, [place] Marie , RN

## 2012-05-09 ENCOUNTER — Encounter (HOSPITAL_COMMUNITY): Payer: Self-pay | Admitting: Neurosurgery

## 2013-12-01 IMAGING — CR DG LUMBAR SPINE 2-3V
2 series · 2 of 2 positions shown · non-contrast
Comparison: Preoperative radiographs 04/23/2012

CLINICAL DATA: Lumbar microdiskectomy

LUMBAR SPINE - 2-3 VIEW

[view not recorded (1 of 2)]
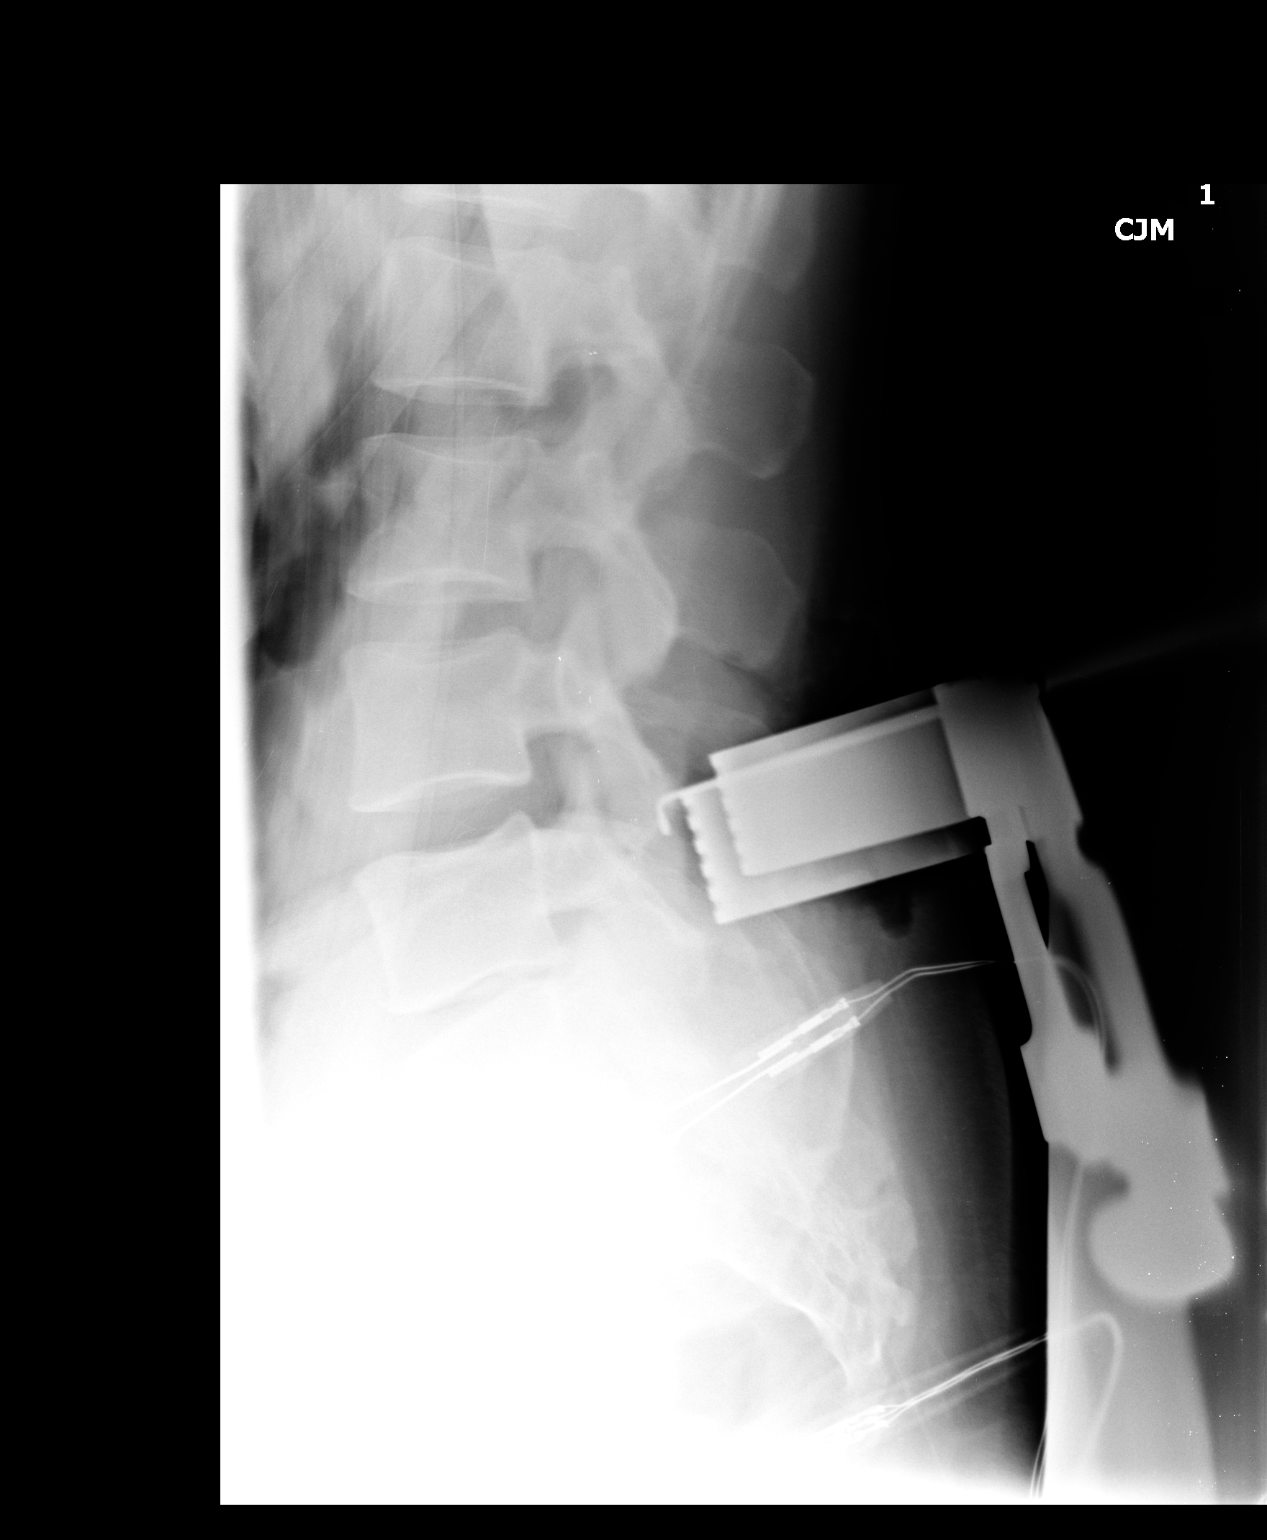

[view not recorded (2 of 2)]
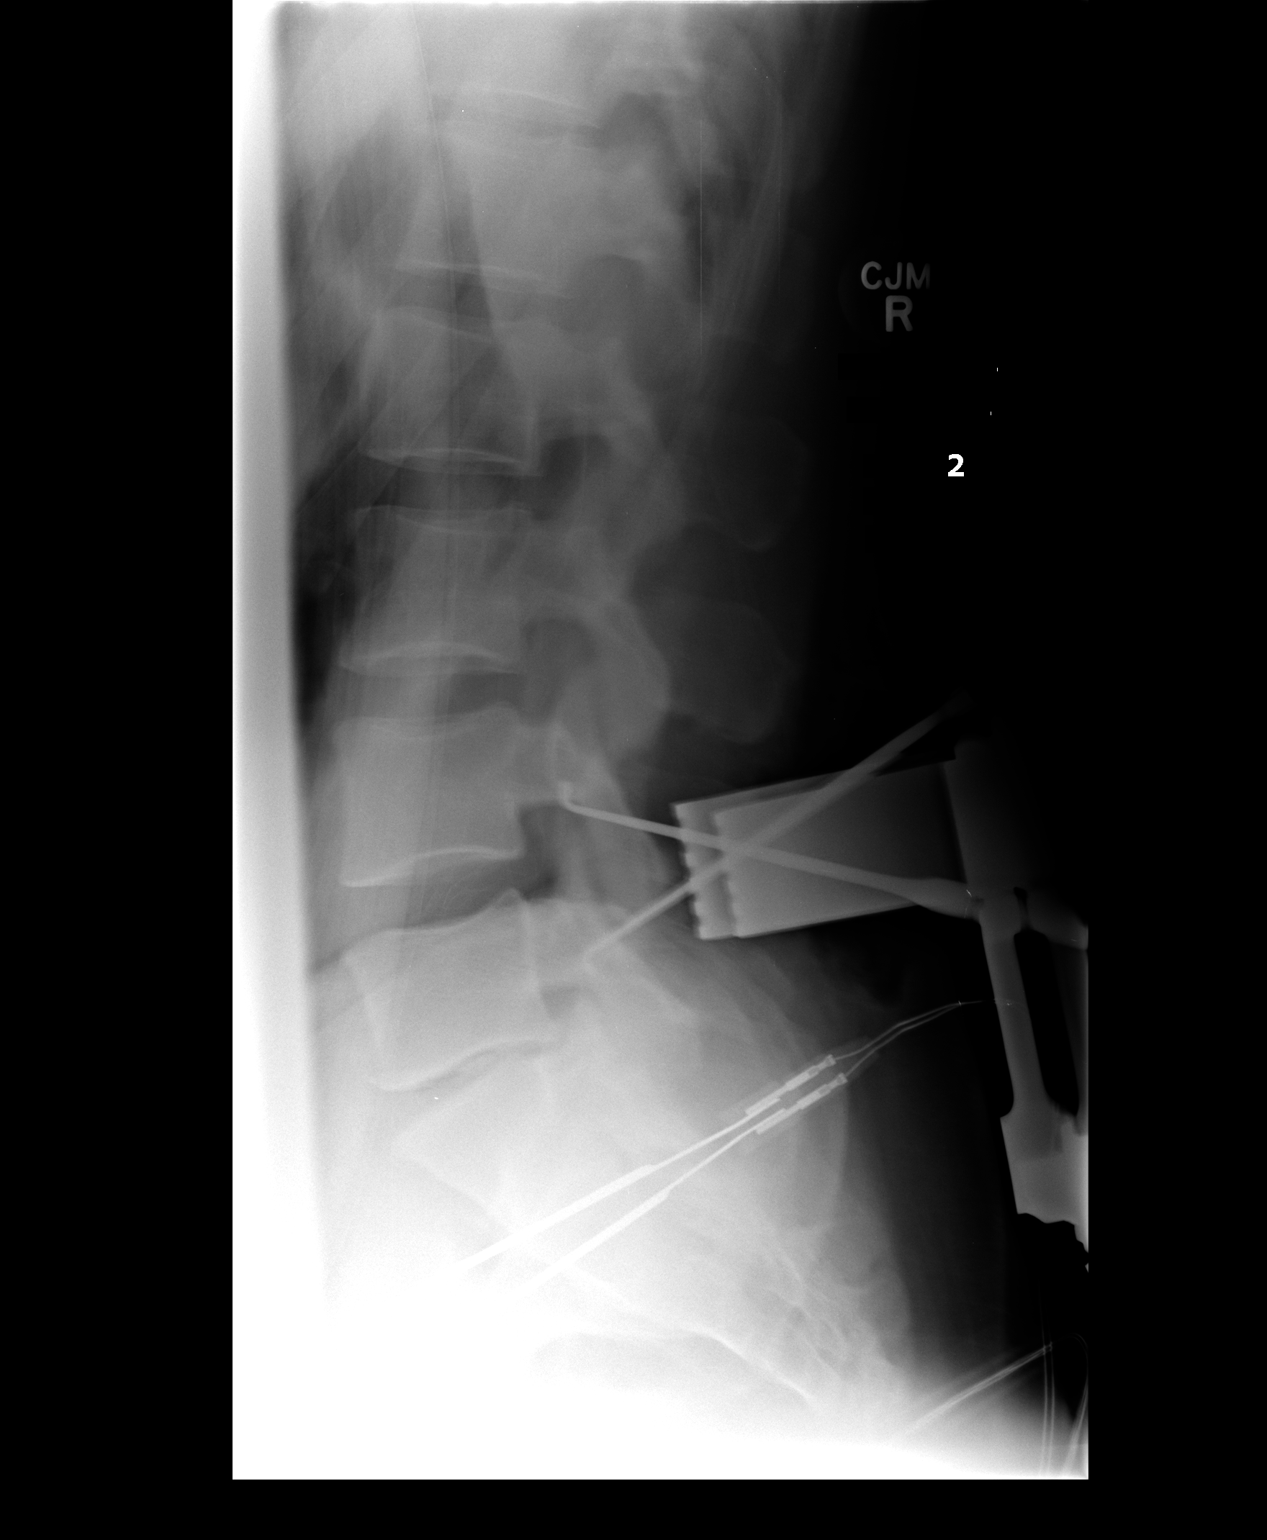

[2 of 2 positions shown; findings below may reference images not displayed]

FINDINGS: Tissue spreaders positioned at the L4-L5 facet level.  A
metallic probe is positioned over the inferior articulating process
of L4.  Degenerative disc disease is noted, predominately at L5-S1.
The second image demonstrates metallic probes at the lamina of L4,
and L5.
IMPRESSION: Intraoperative spot images depict metallic probes positioned at the
lamina of L4 and L5 as detailed above.

## 2015-01-30 ENCOUNTER — Emergency Department (HOSPITAL_BASED_OUTPATIENT_CLINIC_OR_DEPARTMENT_OTHER): Payer: PRIVATE HEALTH INSURANCE

## 2015-01-30 ENCOUNTER — Encounter (HOSPITAL_BASED_OUTPATIENT_CLINIC_OR_DEPARTMENT_OTHER): Payer: Self-pay | Admitting: *Deleted

## 2015-01-30 ENCOUNTER — Emergency Department (HOSPITAL_BASED_OUTPATIENT_CLINIC_OR_DEPARTMENT_OTHER)
Admission: EM | Admit: 2015-01-30 | Discharge: 2015-01-30 | Disposition: A | Discharge status: Home / Self Care | Payer: PRIVATE HEALTH INSURANCE | Attending: Emergency Medicine | Admitting: Emergency Medicine

## 2015-01-30 DIAGNOSIS — N50812 Left testicular pain: Secondary | ICD-10-CM

## 2015-01-30 DIAGNOSIS — K219 Gastro-esophageal reflux disease without esophagitis: Secondary | ICD-10-CM | POA: Diagnosis not present

## 2015-01-30 DIAGNOSIS — F17210 Nicotine dependence, cigarettes, uncomplicated: Secondary | ICD-10-CM | POA: Insufficient documentation

## 2015-01-30 DIAGNOSIS — Z79899 Other long term (current) drug therapy: Secondary | ICD-10-CM | POA: Insufficient documentation

## 2015-01-30 DIAGNOSIS — Z8709 Personal history of other diseases of the respiratory system: Secondary | ICD-10-CM | POA: Insufficient documentation

## 2015-01-30 DIAGNOSIS — I861 Scrotal varices: Secondary | ICD-10-CM | POA: Diagnosis not present

## 2015-01-30 DIAGNOSIS — R1030 Lower abdominal pain, unspecified: Secondary | ICD-10-CM | POA: Diagnosis present

## 2015-01-30 LAB — URINALYSIS, ROUTINE W REFLEX MICROSCOPIC
BILIRUBIN URINE: NEGATIVE
Glucose, UA: NEGATIVE mg/dL
Ketones, ur: NEGATIVE mg/dL
Leukocytes, UA: NEGATIVE
NITRITE: NEGATIVE
PROTEIN: NEGATIVE mg/dL
Specific Gravity, Urine: 1.005 (ref 1.005–1.030)
pH: 6 (ref 5.0–8.0)

## 2015-01-30 LAB — URINE MICROSCOPIC-ADD ON: WBC UA: NONE SEEN WBC/hpf (ref 0–5)

## 2015-01-30 MED ORDER — IBUPROFEN 800 MG PO TABS
800.0000 mg | ORAL_TABLET | Freq: Once | ORAL | Status: AC
  Administered 2015-01-30: 800 mg via ORAL
  Filled 2015-01-30: qty 1

## 2015-01-30 NOTE — ED Provider Notes (Signed)
CSN: [id number]     Arrival date & time 01/30/15  1832 History  By signing my name below, I, Tula Nakayama, attest that this documentation has been prepared under the direction and in the presence of Deno Etienne, DO.  Electronically Signed: Tula Nakayama, ED Scribe. 01/30/2015. 6:55 PM.   Chief Complaint  Patient presents with  . Groin Pain   The history is provided by the patient. No language interpreter was used.    HPI Comments: Chris Hunt is a 49 y.o. male who presents to the Emergency Department complaining of intermittent, moderate, stabbing groin pain that started several years ago and became worse today. He states pain with urination that started today as an associated symptom. His pain becomes worse with walking, lying down and bending forward to pick up objects from the floor. Pt was evaluated for pain previously, most recently 2 years ago, and had a normal prostate exam. He denies recent injuries or falls. Pt also denies fever, chills, penile pain, testicular pain, and bulges or masses.   Past Medical History  Diagnosis Date  . Tobacco abuse   . GERD (gastroesophageal reflux disease)   . Allergy     Allergic rhinitis   Past Surgical History  Procedure Laterality Date  . Esophagus surgery  84    " holes in esophagus"  . Lumbar laminectomy/decompression microdiscectomy N/A 05/04/2012    Procedure: LUMBAR LAMINECTOMY/DECOMPRESSION MICRODISCECTOMY 2 LEVELS;  Surgeon: Floyce Stakes, MD;  Location: [facility];  Service: Neurosurgery;  Laterality: N/A;  Bilateral Lumbar Five-Sacral One Diskectomy/Left Lumbar Four-Five Foraminotomy   . Back surgery     No family history on file. Social History  Substance Use Topics  . Smoking status: Current Every Day Smoker -- 1.00 packs/day    Types: Cigarettes  . Smokeless tobacco: None  . Alcohol Use: 7.2 oz/week    12 Cans of beer per week   Review of Systems  Constitutional: Negative for fever and chills.  HENT: Negative  for congestion and facial swelling.   Eyes: Negative for discharge and visual disturbance.  Respiratory: Negative for shortness of breath.   Cardiovascular: Negative for chest pain and palpitations.  Gastrointestinal: Negative for vomiting, abdominal pain and diarrhea.  Genitourinary: Negative for penile pain and testicular pain.       +Groin pain  Musculoskeletal: Negative for myalgias and arthralgias.  Skin: Negative for color change and rash.  Neurological: Negative for tremors, syncope and headaches.  Psychiatric/Behavioral: Negative for confusion and dysphoric mood.  All other systems reviewed and are negative.  Allergies  Shellfish allergy  Home Medications   Prior to Admission medications   Medication Sig Start Date End Date Taking? Authorizing Provider  diazepam (VALIUM) 5 MG tablet Take 5 mg by mouth every 6 (six) hours as needed for anxiety.    Historical Provider, MD  omeprazole (PRILOSEC) 20 MG capsule Take 20 mg by mouth 2 (two) times daily. 10/14/10   Josalyn Funches, MD  oxyCODONE (ROXICODONE) 15 MG immediate release tablet Take 15 mg by mouth every 4 (four) hours as needed for pain.    Historical Provider, MD  oxyCODONE 20 MG TABS Take 0.75 tablets (15 mg total) by mouth every 4 (four) hours as needed for pain. 05/06/12   Erline Levine, MD   BP 123/71 mmHg  Pulse 70  Temp(Src) 98 F (36.7 C) (Oral)  Resp 20  Ht 6\' 1"  (1.854 m)  Wt 208 lb (94.348 kg)  BMI 27.45 kg/m2  SpO2 97%  Physical Exam  Constitutional: He is oriented to person, place, and time. He appears well-developed and well-nourished.  HENT:  Head: Normocephalic and atraumatic.  Eyes: EOM are normal. Pupils are equal, round, and reactive to light.  Neck: Normal range of motion. Neck supple. No JVD present.  Cardiovascular: Normal rate and regular rhythm.  Exam reveals no gallop and no friction rub.   No murmur heard. Pulmonary/Chest: No respiratory distress. He has no wheezes.  Abdominal: He exhibits no  distension. There is no rebound and no guarding. Hernia confirmed negative in the right inguinal area and confirmed negative in the left inguinal area.  Genitourinary: Prostate normal. Prostate is not enlarged and not tender. Right testis shows no mass, no swelling and no tenderness. Left testis shows no mass, no swelling and no tenderness. Uncircumcised.  Ropey texture to the posterior aspect of left testicle about the epididymis Prostate normal size and nontender No noted penile drainage  Musculoskeletal: Normal range of motion.  Lymphadenopathy:       Right: No inguinal adenopathy present.       Left: No inguinal adenopathy present.  Neurological: He is alert and oriented to person, place, and time.  Skin: No rash noted. No pallor.  Psychiatric: He has a normal mood and affect. His behavior is normal.  Nursing note and vitals reviewed.   ED Course  Procedures  DIAGNOSTIC STUDIES: Oxygen Saturation is 100% on RA, normal by my interpretation.    COORDINATION OF CARE: 6:54 PM Discussed treatment plan with pt which includes UA and [place]. He agreed to plan.  Labs Review Labs Reviewed  URINALYSIS, ROUTINE W REFLEX MICROSCOPIC (NOT AT [facility]) - Abnormal; Notable for the following:    Hgb urine dipstick TRACE (*)    All other components within normal limits  URINE MICROSCOPIC-ADD ON - Abnormal; Notable for the following:    Squamous Epithelial / LPF 0-5 (*)    Bacteria, UA RARE (*)    All other components within normal limits    Imaging Review US Scrotum  01/30/2015  CLINICAL DATA:  49 year old with left scrotal pain for 12 hours. Dilated vessels on physical examination. EXAM: SCROTAL ULTRASOUND DOPPLER ULTRASOUND OF THE TESTICLES TECHNIQUE: Complete ultrasound examination of the testicles, epididymis, and other scrotal structures was performed. Color and spectral Doppler ultrasound were also utilized to evaluate blood flow to the testicles. COMPARISON:  None. FINDINGS: Right testicle  Measurements: 4.3 x 2.5 x 3.4 cm. No mass or microlithiasis visualized. Normal blood flow with color Doppler. Left testicle Measurements: 3.0 x 1.8 x 3.0 cm. No mass or microlithiasis visualized. Normal blood flow with color Doppler. Right epididymis:  Normal in size and appearance. Left epididymis:  Normal in size and appearance. Hydrocele:  None visualized. Varicocele:  Left-sided varicocele noted. Pulsed Doppler interrogation of both testes demonstrates normal low resistance arterial and venous waveforms bilaterally. IMPRESSION: 1. Both testes appear normal.  No evidence of testicular torsion. 2. Small left-sided varicocele. Electronically Signed   By: Richardean Sale M.D.   On: 01/30/2015 20:13   [place] Art/ven Flow Abd Pelv Doppler  01/30/2015  CLINICAL DATA:  49 year old with left scrotal pain for 12 hours. Dilated vessels on physical examination. EXAM: SCROTAL ULTRASOUND DOPPLER ULTRASOUND OF THE TESTICLES TECHNIQUE: Complete ultrasound examination of the testicles, epididymis, and other scrotal structures was performed. Color and spectral Doppler ultrasound were also utilized to evaluate blood flow to the testicles. COMPARISON:  None. FINDINGS: Right testicle Measurements: 4.3 x 2.5 x 3.4 cm. No mass or  microlithiasis visualized. Normal blood flow with color Doppler. Left testicle Measurements: 3.0 x 1.8 x 3.0 cm. No mass or microlithiasis visualized. Normal blood flow with color Doppler. Right epididymis:  Normal in size and appearance. Left epididymis:  Normal in size and appearance. Hydrocele:  None visualized. Varicocele:  Left-sided varicocele noted. Pulsed Doppler interrogation of both testes demonstrates normal low resistance arterial and venous waveforms bilaterally. IMPRESSION: 1. Both testes appear normal.  No evidence of testicular torsion. 2. Small left-sided varicocele. Electronically Signed   By: Richardean Sale M.D.   On: 01/30/2015 20:13   I have personally reviewed and evaluated these  images and lab results as part of my medical decision-making.   EKG Interpretation None      MDM   Final diagnoses:  Pain in left testicle  Varicocele    49 yo M with a chief complaint of left-sided groin pain. This been going on for many many years. Patient is unsure what he was diagnosed with previously. Pain suddenly worsened today. Worse with walking palpation sitting. Patient denies fevers or chills denies injury. On exam patient with nodularity in the posterior aspect of the testicle. Currently feel like this is most likely a varicocele. With sudden worsening tenderness will rule out torsion with an ultrasound.  Ultrasound negative for torsion. Patient with a left-sided varicocele. Will follow with urology as this has been bothering him for many years.  I personally performed the services described in this documentation, which was scribed in my presence. The recorded information has been reviewed and is accurate.  9:27 PM:  I have discussed the diagnosis/risks/treatment options with the patient and family and believe the pt to be eligible for discharge home to follow-up with Urology. We also discussed returning to the ED immediately if new or worsening sx occur. We discussed the sx which are most concerning (e.g., sudden worsening pain, fever, inability to tolerate by mouth) that necessitate immediate return. Medications administered to the patient during their visit and any new prescriptions provided to the patient are listed below.  Medications given during this visit Medications  ibuprofen (ADVIL,MOTRIN) tablet 800 mg (800 mg Oral Given 01/30/15 2043)    Discharge Medication List as of 01/30/2015  8:18 PM      The patient appears reasonably screen and/or stabilized for discharge and I doubt any other medical condition or other [facility] requiring further screening, evaluation, or treatment in the ED at this time prior to discharge.       Deno Etienne, DO 01/30/15 2127

## 2015-01-30 NOTE — ED Notes (Signed)
Groin pain. Stabbing pain. States pain has been on and off for years. No swelling. Urinary frequency.

## 2015-01-30 NOTE — ED Notes (Signed)
Rectal exam performed by MD with chaperone present

## 2015-01-30 NOTE — ED Notes (Signed)
MD at bedside. 

## 2015-01-30 NOTE — Discharge Instructions (Signed)
Take 4 over the counter ibuprofen tablets 3 times a day or 2 over-the-counter naproxen tablets twice a day for pain.  Varicocele A varicocele is a swelling of veins in the scrotum. The scrotum is the sac that contains the testicles. Varicoceles can occur on either side of the scrotum, but they are more common on the left side. They occur most often in teenage boys and young men. In most cases, varicoceles are not a serious problem. They are usually small and painless and do not require treatment. Tests may be done to confirm the diagnosis. Treatment may be needed if:  A varicocele is large, causes a lot of pain, or causes pain when exercising.  Varicoceles are found on both sides of the scrotum.  The testicle on the opposite side is absent or not normal.  A varicocele causes a decrease in the size of the testicle in a growing adolescent.  The person has fertility problems. CAUSES This condition is the result of valves in the veins not working properly. Valves in the veins help to return blood from the scrotum and testicles to the heart. If these valves do not work well, blood flows backward and backs up into the veins, which causes the veins to swell. This is similar to what happens when varicose veins form in the leg. SYMPTOMS Most varicoceles do not cause any symptoms. If symptoms do occur, they may include:  Swelling on one side of the scrotum. The swelling may be more obvious when you are standing up.  A lumpy feeling in the scrotum.  A heavy feeling on one side of the scrotum.  A dull ache in the scrotum, especially after exercise or prolonged standing or sitting.  Slower growth or reduced size of the testicle on the side of the varicocele (in young males).  Problems with fertility. These can occur if the testicle does not grow normally. DIAGNOSIS This condition may be diagnosed with a physical exam. You may also have an imaging test, called an ultrasound, to confirm the  diagnosis and to help rule out other causes of the swelling. TREATMENT Treatment is usually not needed for this condition. If you have any pain, your health care provider may prescribe or recommend medicine to help relieve it. You may need regular exams so your health care provider can monitor the varicocele to ensure that it does not cause problems. When further treatment is needed, it may involve one of these options:  Varicocelectomy. This is a surgery in which the swollen veins are tied off so that the flow of blood goes to other veins instead.  Embolization. In this procedure, a small tube (catheter) is used to place metal coils or other blocking items in the veins. This cuts off the blood flow to the swollen veins. HOME CARE INSTRUCTIONS  Take medicines only as directed by your health care provider.  Wear supportive underwear.  Use an athletic supporter for sports.  Keep all follow-up visits as directed by your health care provider. This is important. SEEK MEDICAL CARE IF:  Your pain is increasing.  You have redness in the affected area.  You have swelling that does not decrease when you are lying down.  One of your testicles is smaller than the other.  Your testicle becomes enlarged, swollen, or painful.   This information is not intended to replace advice given to you by your health care provider. Make sure you discuss any questions you have with your health care provider.   Document  Released: 05/23/2000 Document Revised: 07/01/2014 Document Reviewed: 01/22/2014 Elsevier Interactive Patient Education [employer].

## 2015-01-30 NOTE — ED Notes (Signed)
Pt given water per okay from Dr. Tyrone Nine. Urine cup at bedside and pt aware of need for sample

## 2015-03-17 ENCOUNTER — Emergency Department (HOSPITAL_COMMUNITY)
Admission: EM | Admit: 2015-03-17 | Discharge: 2015-03-17 | Disposition: A | Discharge status: Home / Self Care | Payer: 59 | Attending: Physician Assistant | Admitting: Physician Assistant

## 2015-03-17 ENCOUNTER — Emergency Department (HOSPITAL_COMMUNITY): Payer: 59

## 2015-03-17 ENCOUNTER — Encounter (HOSPITAL_COMMUNITY): Payer: Self-pay | Admitting: Emergency Medicine

## 2015-03-17 DIAGNOSIS — F17210 Nicotine dependence, cigarettes, uncomplicated: Secondary | ICD-10-CM | POA: Diagnosis not present

## 2015-03-17 DIAGNOSIS — J111 Influenza due to unidentified influenza virus with other respiratory manifestations: Secondary | ICD-10-CM

## 2015-03-17 DIAGNOSIS — R0981 Nasal congestion: Secondary | ICD-10-CM

## 2015-03-17 DIAGNOSIS — K219 Gastro-esophageal reflux disease without esophagitis: Secondary | ICD-10-CM | POA: Diagnosis not present

## 2015-03-17 DIAGNOSIS — R067 Sneezing: Secondary | ICD-10-CM | POA: Insufficient documentation

## 2015-03-17 DIAGNOSIS — R062 Wheezing: Secondary | ICD-10-CM | POA: Diagnosis not present

## 2015-03-17 DIAGNOSIS — R059 Cough, unspecified: Secondary | ICD-10-CM

## 2015-03-17 DIAGNOSIS — M791 Myalgia: Secondary | ICD-10-CM | POA: Insufficient documentation

## 2015-03-17 DIAGNOSIS — R05 Cough: Secondary | ICD-10-CM | POA: Diagnosis not present

## 2015-03-17 DIAGNOSIS — Z79899 Other long term (current) drug therapy: Secondary | ICD-10-CM | POA: Insufficient documentation

## 2015-03-17 DIAGNOSIS — R52 Pain, unspecified: Secondary | ICD-10-CM

## 2015-03-17 MED ORDER — IPRATROPIUM-ALBUTEROL 0.5-2.5 (3) MG/3ML IN SOLN
3.0000 mL | Freq: Once | RESPIRATORY_TRACT | Status: AC
  Administered 2015-03-17: 3 mL via RESPIRATORY_TRACT
  Filled 2015-03-17: qty 3

## 2015-03-17 MED ORDER — BENZONATATE 100 MG PO CAPS
100.0000 mg | ORAL_CAPSULE | Freq: Once | ORAL | Status: AC
  Administered 2015-03-17: 100 mg via ORAL
  Filled 2015-03-17: qty 1

## 2015-03-17 MED ORDER — ACETAMINOPHEN 500 MG PO TABS
1000.0000 mg | ORAL_TABLET | Freq: Once | ORAL | Status: AC
  Administered 2015-03-17: 1000 mg via ORAL
  Filled 2015-03-17: qty 2

## 2015-03-17 MED ORDER — BENZONATATE 100 MG PO CAPS: 100.0000 mg | ORAL_CAPSULE | Freq: Three times a day (TID) | ORAL | Status: DC

## 2015-03-17 MED ORDER — ALBUTEROL SULFATE (2.5 MG/3ML) 0.083% IN NEBU
2.5000 mg | INHALATION_SOLUTION | Freq: Once | RESPIRATORY_TRACT | Status: AC
  Administered 2015-03-17: 2.5 mg via RESPIRATORY_TRACT
  Filled 2015-03-17: qty 3

## 2015-03-17 MED ORDER — IBUPROFEN 800 MG PO TABS: 800.0000 mg | ORAL_TABLET | Freq: Three times a day (TID) | ORAL | Status: DC

## 2015-03-17 NOTE — ED Provider Notes (Signed)
CSN: [id number]     Arrival date & time 03/17/15  [id number] History   First MD Initiated Contact with Patient 03/17/15 [phone]     Chief Complaint  Patient presents with  . Cough  . URI     (Consider location/radiation/quality/duration/timing/severity/associated sxs/prior Treatment) HPI   Chris Hunt is a 50 y.o. male, with a history of allergic rhinitis, presenting to the ED with productive cough with yellow sputum, nasal congestion and sneezing, and body aches for the last 6 days. Pt states he has tried Tylenol Cold and Flu and Vicks Vaporub with no improvement. Pt denies chest pain, shortness of breath, abdominal pain, fever/chills, N/V/C/D, urinary complaints, or any other complaints.   Past Medical History  Diagnosis Date  . Tobacco abuse   . GERD (gastroesophageal reflux disease)   . Allergy     Allergic rhinitis   Past Surgical History  Procedure Laterality Date  . Esophagus surgery  84    " holes in esophagus"  . Lumbar laminectomy/decompression microdiscectomy N/A 05/04/2012    Procedure: LUMBAR LAMINECTOMY/DECOMPRESSION MICRODISCECTOMY 2 LEVELS;  Surgeon: Floyce Stakes, MD;  Location: [place] [facility];  Service: Neurosurgery;  Laterality: N/A;  Bilateral Lumbar Five-Sacral One Diskectomy/Left Lumbar Four-Five Foraminotomy   . Back surgery     History reviewed. No pertinent family history. Social History  Substance Use Topics  . Smoking status: Current Every Day Smoker -- 1.00 packs/day    Types: Cigarettes  . Smokeless tobacco: None  . Alcohol Use: 7.2 oz/week    12 Cans of beer per week    Review of Systems  Constitutional: Negative for fever, chills and diaphoresis.  HENT: Positive for congestion and sneezing.   Respiratory: Positive for cough. Negative for shortness of breath.   Cardiovascular: Negative for chest pain.  Gastrointestinal: Negative for nausea, vomiting, abdominal pain, diarrhea and constipation.  Genitourinary: Negative for dysuria and hematuria.   Musculoskeletal: Positive for myalgias.  Skin: Negative for color change and pallor.  Neurological: Negative for dizziness, light-headedness and headaches.  All other systems reviewed and are negative.     Allergies  Shellfish allergy  Home Medications   Prior to Admission medications   Medication Sig Start Date End Date Taking? Authorizing Provider  omeprazole (PRILOSEC) 20 MG capsule Take 20 mg by mouth 2 (two) times daily as needed (acid reflux).  10/14/10  Yes Josalyn Funches, MD  Phenyleph-Doxylamine-DM-APAP (NYQUIL SEVERE COLD/FLU) 5-6.25-10-325 MG/15ML LIQD Take 5 mLs by mouth every 6 (six) hours as needed (cold/ congestion).   Yes Historical Provider, MD  benzonatate (TESSALON) 100 MG capsule Take 1 capsule (100 mg total) by mouth every 8 (eight) hours. 03/17/15   Aarian Cleaver C Helene Bernstein, PA-C  ibuprofen (ADVIL,MOTRIN) 800 MG tablet Take 1 tablet (800 mg total) by mouth 3 (three) times daily. 03/17/15   Jenasis Straley C Sumayah Bearse, PA-C  oxyCODONE (ROXICODONE) 15 MG immediate release tablet Take 15 mg by mouth every 4 (four) hours as needed for pain.    Historical Provider, MD   BP 125/83 mmHg  Pulse 73  Temp(Src) 98.1 F (36.7 C) (Oral)  Resp 16  SpO2 98% Physical Exam  Constitutional: He appears well-developed and well-nourished. No distress.  HENT:  Head: Normocephalic and atraumatic.  Mouth/Throat: Oropharynx is clear and moist.  Nasal congestion noted.   Eyes: Conjunctivae are normal. Pupils are equal, round, and reactive to light.  Neck: Normal range of motion. Neck supple.  Cardiovascular: Normal rate, regular rhythm and normal heart sounds.   Pulmonary/Chest: Effort  normal. No accessory muscle usage. No tachypnea. No respiratory distress. He has wheezes in the left upper field, the left middle field and the left lower field.  Abdominal: Soft. Bowel sounds are normal.  Musculoskeletal: He exhibits no edema or tenderness.  Lymphadenopathy:    He has no cervical adenopathy.  Neurological: He  is alert.  Skin: Skin is warm and dry. He is not diaphoretic.  Nursing note and vitals reviewed.   ED Course  Procedures (including critical care time)  Imaging Review Dg Chest 2 View  03/17/2015  CLINICAL DATA:  Productive cough x 5 days, wheezing on left. EXAM: CHEST  2 VIEW COMPARISON:  11/09/2010 FINDINGS: The heart size and mediastinal contours are within normal limits. Both lungs are clear. The visualized skeletal structures are unremarkable. IMPRESSION: No active cardiopulmonary disease. Electronically Signed   By: Kathreen Devoid   On: 03/17/2015 10:40   I have personally reviewed and evaluated these images as part of my medical decision-making.   EKG Interpretation None      MDM   Final diagnoses:  Cough  Nasal congestion  Body aches    Chris Hunt presents with productive cough, nasal congestion and sneezing, and body aches for the last 6 days.  This patient's presentation is consistent with a viral illness, such as influenza. However, due to the patient's wheezing especially unilateral wheezing, a chest x-ray was ordered as well as a breathing treatment. Patient showed improvement with conservative management here in the ED. Patient is nontoxic appearing, afebrile, not tachycardic, not tachypneic, is normotensive, maintains SPO2 98% on room air, and is in no apparent distress. Patient's chest x-ray was normal. The patient was given instructions for home care as well as return precautions. Patient voices understanding of these instructions, accepts the plan, and is comfortable with discharge.    Lorayne Bender, PA-C 03/17/15 [place], MD 03/17/15 1605

## 2015-03-17 NOTE — Discharge Instructions (Signed)
You have been seen today for a cough, body aches, and congestion. Your imaging showed no abnormalities. It is likely that you have a viral illness, such as influenza. The treatment for a viral illness is supportive care. Drink plenty of fluids and get plenty of rest. Tylenol or ibuprofen for pain or fever. Tessalon for cough. Use Mucinex to relieve congestion. Follow up with PCP as needed. Return to ED should symptoms worsen.   Emergency Department Resource Guide 1) Find a Doctor and Pay Out of Pocket Although you won't have to find out who is covered by your insurance plan, it is a good idea to ask around and get recommendations. You will then need to call the office and see if the doctor you have chosen will accept you as a new patient and what types of options they offer for patients who are self-pay. Some doctors offer discounts or will set up payment plans for their patients who do not have insurance, but you will need to ask so you aren't surprised when you get to your appointment.  2) Contact Your Local Health Department Not all health departments have doctors that can see patients for sick visits, but many do, so it is worth a call to see if yours does. If you don't know where your local health department is, you can check in your phone book. The CDC also has a tool to help you locate your state's health department, and many state websites also have listings of all of their local health departments.  3) Find a [place] [facility] If your illness is not likely to be very severe or complicated, you may want to try a walk in clinic. These are popping up all over the country in pharmacies, drugstores, and shopping centers. They're usually staffed by nurse practitioners or physician assistants that have been trained to treat common illnesses and complaints. They're usually fairly quick and inexpensive. However, if you have serious medical issues or chronic medical problems, these are probably not your best  option.  No Primary Care Doctor: - Call Health Connect at  [phone] - they can help you locate a primary care doctor that  accepts your insurance, provides certain services, etc. - Physician Referral Service- [phone]  Chronic Pain Problems: Organization         Address  Phone   Notes  [facility]  [phone] Patients need to be referred by their primary care doctor.   Medication Assistance: Organization         Address  Phone   Notes  [facility] Medication [facility] [address] [place], [place] [address] [phone] --Must be a resident of [facility] -- Must have NO insurance coverage whatsoever (no Medicaid/ Medicare, etc.) -- The pt. MUST have a primary care doctor that directs their care regularly and follows them in the community   MedAssist  [phone]   [employer]  [phone]    Agencies that provide inexpensive medical care: Organization         Address  Phone   Notes  [place]  [phone]   [facility]    [phone]   [facility] [place], [place] [address] [phone]   [place] [address] [place] [phone]   Planned Parenthood    [phone]   [facility]    [phone]  [facility]  [address], [place] Phone:  [phone], Fax:  [phone] Hours of Operation:  9 am - 6 pm, M-F.  Also accepts Medicaid/Medicare and self-pay.  [facility] for [place] [address], [address], [place] Phone: [phone], Fax: [phone]. Hours of Operation:  8:30 am - 5:30 pm, M-F.  Also accepts Medicaid and self-pay.  [facility] [place] [address] [place] Phone: [phone]   [place], [place], [place] [phone], Ext. [phone] Mondays & Thursdays: 7-9 AM.  First 15  patients are seen on a first come, first serve basis.    [place] Providers:  Organization         Address  Phone   Notes  [facility] [address],  [phone] Also accepts self-pay patients.  [facility] [address], [place]  [phone]   [place], [address], [place] [phone]   [facility] [address], [place] [phone]   Lucianne Lei [address], [place]   [phone] Only accepts [place] Access [place] patients after they have their name applied to their card.   Self-Pay (no insurance) in [facility]:  Organization         Address  Phone   Notes  Sickle Cell Patients, [facility] Internal Medicine [place] [phone]   [facility] [place] [phone]   [facility] [place]  [place], [place], [place] [phone]   [facility]/Dr. Osei-Bonsu  [address] [place] or [address], [place] [phone] Phone number for both [place] and [place] locations is the same.  Urgent Medical and [facility] [address], [place] [phone]   [facility] [address], [place] or [address] [phone]   [facility] [address], [place] [phone], phone; [phone], fax Sees patients 1st and 3rd Saturday of every month.  Must not qualify for public or private insurance (i.e. Medicaid, Medicare, [place] Health Choice, Veterans' Benefits)  Household income should be no more than 200% of the poverty level The clinic cannot treat you if you are pregnant or think you are pregnant  Sexually transmitted diseases are not treated at the clinic.    Dental  Care: Organization         Address  Phone  Notes  [facility] Department of [facility] Regino Ramirez [phone] Accepts children up to age 62 who are enrolled in [place] or [place]; pregnant women with a Medicaid card; and children who have applied for Medicaid or [place] Health Choice, but were declined, whose parents can pay a reduced fee at time of service.  [facility] Department of [facility]  [address], [place] [phone] Accepts children up to age 71 who are enrolled in [place] or [place]; pregnant women with a Medicaid card; and children who have applied for Medicaid or [place] Health Choice, but were declined, whose parents can pay a reduced fee at time of service.  [place]  [phone]  [place] [phone] Patients are seen by appointment only. Walk-ins are not accepted. [place] will see patients 26 years of age and older. Monday - Tuesday (8am-5pm) Most Wednesdays (8:30-5pm) $30 per visit, cash only  [facility] Adult Dental Access PROGRAM  [address], [facility] [phone] Patients are seen by appointment only. Walk-ins are not accepted. [place] will see patients 62 years of age and older. One Wednesday Evening (Monthly: Volunteer Based).  $30 per visit, cash only  [place]  [phone] for adults; Children under age 39, call Graduate Pediatric Dentistry at [phone]. Children aged 75-14, please call [phone] to request a pediatric application.  Dental services are provided in all areas of dental care including fillings, crowns and bridges, complete and partial dentures, implants, gum treatment, root canals, and extractions. Preventive care is also provided. Treatment is provided to both adults and children. Patients are selected via a lottery and there is often a waiting list.   [facility] [address] [place]  [phone] [email or web address]   [facility] [address] [place], [place] [phone], Ext. [phone] Second and Fourth Thursday of each month, opens at 6:30 AM; Clinic ends at 9 AM.  Patients are seen on a first-come first-served basis, and a limited number are seen during each clinic.   [facility]  [address] Hillard Danker [place], [place] [phone]   Eligibility Requirements You must have lived in [place], [place], or [place] counties for at least the last three months.   You cannot be eligible for state or federal sponsored [employer], including [employer], [place], or [employer].   You generally cannot be eligible for healthcare insurance through your employer.    How to apply: Eligibility screenings are held every Tuesday and Wednesday afternoon from 1:00 pm until 4:00 pm. You do not need an appointment for the interview!  [facility] [address] [place], [place]   [place]  [place] Department  [place]  [phone]    Behavioral Health Resources in the Community: Intensive Outpatient Programs Organization         Address  Phone  Notes  [place]. [address] [place], [place] [phone]   [facility] Outpatient [address] [place], [place]   ADS: [facility] [address], [place], [place]   [place] [address]  [place], [place] or [phone]   Substance Abuse Resources Organization         Address  Phone  Notes  Alcohol and Drug Services  [phone]   [place]  [phone]   [facility] [place]   Chinita Pester  [phone]   Residential & Outpatient Substance Abuse Program  [phone]    Psychological Services Organization         Address  Phone  Notes  [facility] [place]  [place]  [phone]   [place] [address] [place] or [phone]    Mobile Crisis Teams Organization         Address  Phone  Notes  [facility], Mobile Crisis Care Unit  [phone]   [facility]ive Psychotherapeutic Services  [address] [place], [place]  [facility] [address], [address] [place] [phone]    Self-Help/Support Groups Organization         Address  Phone             Notes  [facility] [place] - variety of support groups  Jessie Call for more information  Narcotics Anonymous (NA), [facility] [address], [employer] [place]  2 meetings at this location   Special educational needs teacher         Address  Phone  Notes  ASAP Residential Treatment [place],    [place]  [phone]   [facility]  [address], [place] [phone], [place], [place]   [place], [place] [phone] Admissions: 8am-3pm M-F  Incentives Substance [place] [address]    [place], [place] [phone]   [facility] [address] [place], [place], [place]   [facility] [address]  [place], [place]   [facility]ight Programs - Intensive Outpatient [address] Kristeen [address], [place], [place]   [facility] [place].,  [place], [place] [phone] or [phone]   [facility] Services (RTS) [address] [place], [place] Accepts Medicaid  [facility] [place] [address]  [place] [phone] Substance Abuse/Addiction Treatment   [facility] Organization         Address  Phone  Notes  [facility]  [phone]   Domenic Schwab, PhD [address] [place], [place]   [phone] or [phone]   [place], [place] [phone]   [facility] [address] [place], [place] [phone] Insurance/Medicaid/sponsorship through [facility] and Families [address], [address] [place]                                    [place], [place] [phone] [place] [address], [place] [phone]    Dr. Adele Schilder  [phone]   [facility] Dept. 1) [address], [place] 2) [place] 3)  [address], [place] [phone]  [phone]   [place] [phone] or [phone] (After Hours)

## 2015-03-17 NOTE — Congregational Nurse Program (Signed)
Congregational Nurse Program Note  Date of Encounter: 03/17/2015  Past Medical History: Past Medical History  Diagnosis Date  . Tobacco abuse   . GERD (gastroesophageal reflux disease)   . Allergy     Allergic rhinitis    Encounter Details:     CNP Questionnaire - 03/17/15 1522    Patient Demographics   Is this a new or existing patient? New   Patient is considered a/an Not Applicable   Race African-American/Black   Patient Assistance   Location of Patient Assistance Not Applicable   Patient's financial/insurance status Private Insurance Coverage;Self-Pay   Uninsured Patient No   Patient referred to apply for the following financial assistance Not Applicable   Food insecurities addressed Not Applicable   Transportation assistance No   Assistance securing medications No   Educational health offerings Acute disease;Nutrition   Encounter Details   Primary purpose of visit Acute Illness/Condition Visit;Education/Health Concerns;Post ED/Hospitalization Visit;Spiritual Care/Support Visit   Was an Emergency Department visit averted? No   Does patient have a medical provider? No   Patient referred to Establish PCP   Was a mental health screening completed? (GAINS tool) No   Does patient have dental issues? No   Since previous encounter, have you referred patient for abnormal blood pressure that resulted in a new diagnosis or medication change? No   Since previous encounter, have you referred patient for abnormal blood glucose that resulted in a new diagnosis or medication change? No   For Abstraction Use Only   Does patient have insurance? Yes    Initial   Visit  To see nurse . In after  Going to  ED. States  He  Had a cough  For 5-6 days  And  Was coughing  So  Bad  Couldn't  Sleep. Trying to  Work  But  [employer] No  Fever if  Some  Little  Just  Cough  He  Reports  , coughing  really  Bad  at night  .  Doesn't  have a PCP  needs  one , smoker  , 10  per day , not  married  has   4  children ,oldest  Is  95 (2) , 41  And  36.  Has a brother  And   Sister that  Lives here in town. [place] Kitchen  History  Of  Acid  Reflux, takes OC medication  For it .  Had  Nebulizer  Treatment while in  Hospital , chest  Was tight ,no  Pain  ,states  No  Indication  Of  Chest  Not  Being  Clear. Mannie Stabile in [employer] for 3-6 months [employer]  Had a injury  Was D/ C .  Nurse  Counseled  Regarding  Cold  Cough, germs , states he  , didn't  Get a flu  Shot this  Year ,Instructed  To  Take  Medication, drink  Plenty  Of  Fluids , eat  Well and  Call  For  PCP  Appointment.  Nurse  Circled  Offices  Taking  New  Clients  , gave  Client  4  To  Call , feel  Better  Return  To  See nurse  After feeling  Better  But  Call for PCP. Received  Nebulizer  Treatment while  At  [place] visit  made .  Counseled  Regarding  Use  Of  PCP, urgent center  and  ED .

## 2015-03-17 NOTE — ED Notes (Signed)
Pt reports productive cough, nasal stuffiness, sneezing and chest congestion for the past 6 days.

## 2015-12-18 ENCOUNTER — Telehealth: Payer: Self-pay | Admitting: General Practice

## 2015-12-18 NOTE — Telephone Encounter (Signed)
Ok with me, thanks.

## 2015-12-18 NOTE — Telephone Encounter (Signed)
Patient is requesting to be a patient of yours. Please advise. Thank you.

## 2015-12-18 NOTE — Telephone Encounter (Signed)
LVM to inform patient to call back and make an appointment.

## 2016-01-13 ENCOUNTER — Encounter: Payer: Self-pay | Admitting: Internal Medicine

## 2016-01-13 ENCOUNTER — Ambulatory Visit (INDEPENDENT_AMBULATORY_CARE_PROVIDER_SITE_OTHER): Payer: 59 | Admitting: Internal Medicine

## 2016-01-13 ENCOUNTER — Other Ambulatory Visit (INDEPENDENT_AMBULATORY_CARE_PROVIDER_SITE_OTHER): Payer: 59

## 2016-01-13 VITALS — BP 126/70 | HR 63 | Temp 97.8°F | Resp 20 | Wt 203.0 lb

## 2016-01-13 DIAGNOSIS — R739 Hyperglycemia, unspecified: Secondary | ICD-10-CM | POA: Diagnosis not present

## 2016-01-13 DIAGNOSIS — M25511 Pain in right shoulder: Secondary | ICD-10-CM

## 2016-01-13 DIAGNOSIS — M519 Unspecified thoracic, thoracolumbar and lumbosacral intervertebral disc disorder: Secondary | ICD-10-CM | POA: Insufficient documentation

## 2016-01-13 DIAGNOSIS — Z0001 Encounter for general adult medical examination with abnormal findings: Secondary | ICD-10-CM | POA: Diagnosis not present

## 2016-01-13 DIAGNOSIS — K219 Gastro-esophageal reflux disease without esophagitis: Secondary | ICD-10-CM

## 2016-01-13 DIAGNOSIS — J069 Acute upper respiratory infection, unspecified: Secondary | ICD-10-CM

## 2016-01-13 DIAGNOSIS — J309 Allergic rhinitis, unspecified: Secondary | ICD-10-CM | POA: Diagnosis not present

## 2016-01-13 DIAGNOSIS — Z9884 Bariatric surgery status: Secondary | ICD-10-CM | POA: Insufficient documentation

## 2016-01-13 LAB — CBC WITH DIFFERENTIAL/PLATELET
BASOS PCT: 0.4 % (ref 0.0–3.0)
Basophils Absolute: 0 10*3/uL (ref 0.0–0.1)
EOS ABS: 0.2 10*3/uL (ref 0.0–0.7)
EOS PCT: 2.2 % (ref 0.0–5.0)
HEMATOCRIT: 45.5 % (ref 39.0–52.0)
HEMOGLOBIN: 15.3 g/dL (ref 13.0–17.0)
LYMPHS PCT: 24.4 % (ref 12.0–46.0)
Lymphs Abs: 2.2 10*3/uL (ref 0.7–4.0)
MCHC: 33.5 g/dL (ref 30.0–36.0)
MCV: 85.5 fl (ref 78.0–100.0)
MONOS PCT: 4.9 % (ref 3.0–12.0)
Monocytes Absolute: 0.5 10*3/uL (ref 0.1–1.0)
NEUTROS ABS: 6.2 10*3/uL (ref 1.4–7.7)
Neutrophils Relative %: 68.1 % (ref 43.0–77.0)
PLATELETS: 198 10*3/uL (ref 150.0–400.0)
RBC: 5.32 Mil/uL (ref 4.22–5.81)
RDW: 15.4 % (ref 11.5–15.5)
WBC: 9.1 10*3/uL (ref 4.0–10.5)

## 2016-01-13 LAB — URINALYSIS, ROUTINE W REFLEX MICROSCOPIC
Bilirubin Urine: NEGATIVE
Ketones, ur: NEGATIVE
LEUKOCYTES UA: NEGATIVE
NITRITE: NEGATIVE
SPECIFIC GRAVITY, URINE: 1.015 (ref 1.000–1.030)
Total Protein, Urine: NEGATIVE
URINE GLUCOSE: NEGATIVE
Urobilinogen, UA: 0.2 (ref 0.0–1.0)
pH: 5.5 (ref 5.0–8.0)

## 2016-01-13 LAB — BASIC METABOLIC PANEL
BUN: 11 mg/dL (ref 6–23)
CALCIUM: 9.7 mg/dL (ref 8.4–10.5)
CHLORIDE: 105 meq/L (ref 96–112)
CO2: 28 mEq/L (ref 19–32)
CREATININE: 0.96 mg/dL (ref 0.40–1.50)
GFR: 106.83 mL/min (ref >60.00)
Glucose, Bld: 98 mg/dL (ref 70–99)
Potassium: 4.3 mEq/L (ref 3.5–5.1)
Sodium: 139 mEq/L (ref 135–145)

## 2016-01-13 LAB — PSA: PSA: 0.67 ng/mL (ref 0.10–4.00)

## 2016-01-13 LAB — HEPATIC FUNCTION PANEL
ALT: 12 U/L (ref 0–53)
AST: 15 U/L (ref 0–37)
Albumin: 4.4 g/dL (ref 3.5–5.2)
Alkaline Phosphatase: 90 U/L (ref 39–117)
BILIRUBIN DIRECT: 0.1 mg/dL (ref 0.0–0.3)
BILIRUBIN TOTAL: 0.6 mg/dL (ref 0.2–1.2)
Total Protein: 7 g/dL (ref 6.0–8.3)

## 2016-01-13 LAB — LIPID PANEL
CHOL/HDL RATIO: 2
Cholesterol: 180 mg/dL (ref 0–200)
HDL: 75 mg/dL (ref >39.00)
LDL CALC: 91 mg/dL (ref 0–99)
NONHDL: 105.38
TRIGLYCERIDES: 72 mg/dL (ref 0.0–149.0)
VLDL: 14.4 mg/dL (ref 0.0–40.0)

## 2016-01-13 LAB — HEMOGLOBIN A1C: Hgb A1c MFr Bld: 5.9 % (ref 4.6–6.5)

## 2016-01-13 LAB — TSH: TSH: 0.57 u[IU]/mL (ref 0.35–4.50)

## 2016-01-13 MED ORDER — OMEPRAZOLE 20 MG PO CPDR: 20.0000 mg | DELAYED_RELEASE_CAPSULE | Freq: Two times a day (BID) | ORAL | 3 refills | Status: DC | PRN

## 2016-01-13 MED ORDER — CYCLOBENZAPRINE HCL 5 MG PO TABS: 5.0000 mg | ORAL_TABLET | Freq: Three times a day (TID) | ORAL | 1 refills | Status: DC | PRN

## 2016-01-13 NOTE — Patient Instructions (Signed)
Please try the OTC Zyrtec and Nasacort for allergies  Please take all new medication as prescribed - the muscle relaxer for the right shoulder;  Please call in 1 week if not improved for referral to Dr Smith/Sports medicine in this office  Please continue all other medications as before, and refills have been done if requested - the omeprazole  Please have the pharmacy call with any other refills you may need.  Please continue your efforts at being more active, low cholesterol diet, and weight control.  You are otherwise up to date with prevention measures today.  Please keep your appointments with your specialists as you may have planned  Please go to the LAB in the Basement (turn left off the elevator) for the tests to be done today  You will be contacted by phone if any changes need to be made immediately.  Otherwise, you will receive a letter about your results with an explanation, but please check with MyChart first.  Please remember to sign up for MyChart if you have not done so, as this will be important to you in the future with finding out test results, communicating by private email, and scheduling acute appointments online when needed.  Please return in 1 year for your yearly visit, or sooner if needed, with Lab testing done 3-5 days before

## 2016-01-13 NOTE — Progress Notes (Signed)
Pre visit review using our clinic review tool, if applicable. No additional management support is needed unless otherwise documented below in the visit note. 

## 2016-01-13 NOTE — Progress Notes (Signed)
Subjective:    Patient ID: Chris Hunt, male    DOB: 11/18/1965, 50 y.o.   MRN: [id number]  HPI  Here for wellness and f/u;  Overall doing ok;  Pt denies Chest pain, worsening SOB, DOE, wheezing, orthopnea, PND, worsening LE edema, palpitations, dizziness or syncope.  Pt denies neurological change such as new headache, facial or extremity weakness.  Pt denies polydipsia, polyuria, or low sugar symptoms. Pt states overall good compliance with treatment and medications, good tolerability, and has been trying to follow appropriate diet.  Pt denies worsening depressive symptoms, suicidal ideation or panic. No fever, night sweats, wt loss, loss of appetite, or other constitutional symptoms.  Pt states good ability with ADL's, has low fall risk, home safety reviewed and adequate, no other significant changes in hearing or vision, and only occasionally active with exercise. Is s/p gastric bypass, peak wt was about 250, now much less Wt Readings from Last 3 Encounters:  01/13/16 203 lb (92.1 kg)  01/30/15 208 lb (94.3 kg)  05/05/12 225 lb 5 oz (102.2 kg)  Declines flu shot for now. No other changes to basic hx  Does have other complaints incidentally - Here with 2-3 days acute onset fever, facial pressure, headache, general weakness and malaise, and clear d/c, with mild ST and and occas nonprod cough, feels much better today  Has had mild worsening reflux mild for several months after ran out of prilosec, but no other abd pain, dysphagia, n/v, bowel change or blood.  Does have several wks ongoing nasal allergy symptoms with clearish congestion, itch and sneezing, without fever, pain, sob or wheezing as above  Also with ongoing right shoulder pain for several wks, mild, intermittent, worse to post aspect and upper back, worse to move shoulder, but no neck pain or more distal arm pain/numbness or weakness. Past Medical History:  Diagnosis Date  . Allergy    Allergic rhinitis  . GERD  (gastroesophageal reflux disease)   . Tobacco abuse    Past Surgical History:  Procedure Laterality Date  . BACK SURGERY    . ESOPHAGUS SURGERY  85   " holes in esophagus"  . LUMBAR LAMINECTOMY/DECOMPRESSION MICRODISCECTOMY N/A 05/04/2012   Procedure: LUMBAR LAMINECTOMY/DECOMPRESSION MICRODISCECTOMY 2 LEVELS;  Surgeon: Floyce Stakes, MD;  Location: [facility];  Service: Neurosurgery;  Laterality: N/A;  Bilateral Lumbar Five-Sacral One Diskectomy/Left Lumbar Four-Five Foraminotomy     reports that he has been smoking Cigarettes.  He has been smoking about 1.00 pack per day. He does not have any smokeless tobacco history on file. He reports that he drinks about 7.2 oz of alcohol per week . He reports that he does not use drugs. family history includes Cancer in his father. Allergies  Allergen Reactions  . Shellfish Allergy Anaphylaxis   Current Outpatient Prescriptions on File Prior to Visit  Medication Sig Dispense Refill  . ibuprofen (ADVIL,MOTRIN) 800 MG tablet Take 1 tablet (800 mg total) by mouth 3 (three) times daily. 21 tablet 0   No current facility-administered medications on file prior to visit.    Review of Systems Constitutional: Negative for increased diaphoresis, or other activity, appetite or siginficant weight change other than noted HENT: Negative for worsening hearing loss, ear pain, facial swelling, mouth sores and neck stiffness.   Eyes: Negative for other worsening pain, redness or visual disturbance.  Respiratory: Negative for choking or stridor Cardiovascular: Negative for other chest pain and palpitations.  Gastrointestinal: Negative for worsening diarrhea, blood in stool, or  abdominal distention Genitourinary: Negative for hematuria, flank pain or change in urine volume.  Musculoskeletal: Negative for myalgias or other joint complaints.  Skin: Negative for other color change and wound or drainage.  Neurological: Negative for syncope and numbness. other than  noted Hematological: Negative for adenopathy. or other swelling Psychiatric/Behavioral: Negative for hallucinations, SI, self-injury, decreased concentration or other worsening agitation.  All other system neg per pt    Objective:   Physical Exam BP 126/70   Pulse 63   Temp 97.8 F (36.6 C) (Oral)   Resp 20   Wt 203 lb (92.1 kg)   SpO2 97%   BMI 26.78 kg/m  VS noted,  Constitutional: Pt is oriented to person, place, and time. Appears well-developed and well-nourished, in no significant distress Head: Normocephalic and atraumatic  Eyes: Conjunctivae and EOM are normal. Pupils are equal, round, and reactive to light Right Ear: External ear normal.  Left Ear: External ear normal Nose: Nose normal.  Mouth/Throat: Oropharynx is clear and moist  Bilat tm's with mild erythema.  Max sinus areas non tender.  Pharynx with mild erythema, no exudate Neck: Normal range of motion. Neck supple. No JVD present. No tracheal deviation present or significant neck LA or mass Cardiovascular: Normal rate, regular rhythm, normal heart sounds and intact distal pulses.   Pulmonary/Chest: Effort normal and breath sounds without rales or wheezing  Abdominal: Soft. Bowel sounds are normal. NT except for mild epigastric tender. No HSM  Musculoskeletal: Normal range of motion. Exhibits no edema right shoulder FROM, NT except for post aspect and trapezoid tender Lymphadenopathy: Has no cervical adenopathy.  Neurological: Pt is alert and oriented to person, place, and time. Pt has normal reflexes. No cranial nerve deficit. Motor grossly intact Skin: Skin is warm and dry. No rash noted or new ulcers Psychiatric:  Has normal mood and affect. Behavior is normal.     Assessment & Plan:

## 2016-01-17 DIAGNOSIS — J069 Acute upper respiratory infection, unspecified: Secondary | ICD-10-CM | POA: Insufficient documentation

## 2016-01-17 NOTE — Assessment & Plan Note (Signed)
C/w msk, for muscle relaxer prn,  to f/u any worsening symptoms or concerns, consider sport medicine f/u if persists

## 2016-01-17 NOTE — Assessment & Plan Note (Addendum)
Mild, c/w viral illness, for OTC mucinex prn, to f/u any worsening symptoms or concerns  In addition to the time spent performing CPE, I spent an additional 20 minutes face to face,in which greater than 50% of this time was spent in counseling and coordination of care for patient's illness as documented.

## 2016-01-17 NOTE — Assessment & Plan Note (Signed)
Mild to mod, for zyrtec and nasacort asd,  to f/u any worsening symptoms or concerns 

## 2016-01-17 NOTE — Assessment & Plan Note (Signed)
stable overall by history and exam, recent data reviewed with pt, and pt to continue medical treatment as before,  to f/u any worsening symptoms or concerns Lab Results  Component Value Date   HGBA1C 5.9 01/13/2016

## 2016-01-17 NOTE — Assessment & Plan Note (Signed)
Mild to mod, for PPI restart,  to f/u any worsening symptoms or concerns 

## 2016-01-17 NOTE — Assessment & Plan Note (Signed)

## 2016-01-28 ENCOUNTER — Emergency Department (HOSPITAL_COMMUNITY): Payer: 59

## 2016-01-28 ENCOUNTER — Emergency Department (HOSPITAL_COMMUNITY)
Admission: EM | Admit: 2016-01-28 | Discharge: 2016-01-28 | Disposition: A | Discharge status: Home / Self Care | Payer: 59 | Attending: Emergency Medicine | Admitting: Emergency Medicine

## 2016-01-28 ENCOUNTER — Encounter (HOSPITAL_COMMUNITY): Payer: Self-pay | Admitting: Vascular Surgery

## 2016-01-28 DIAGNOSIS — Z79899 Other long term (current) drug therapy: Secondary | ICD-10-CM | POA: Insufficient documentation

## 2016-01-28 DIAGNOSIS — F17210 Nicotine dependence, cigarettes, uncomplicated: Secondary | ICD-10-CM | POA: Insufficient documentation

## 2016-01-28 DIAGNOSIS — I471 Supraventricular tachycardia: Secondary | ICD-10-CM | POA: Diagnosis not present

## 2016-01-28 DIAGNOSIS — R0602 Shortness of breath: Secondary | ICD-10-CM | POA: Diagnosis present

## 2016-01-28 LAB — BASIC METABOLIC PANEL
Anion gap: 7 (ref 5–15)
BUN: 11 mg/dL (ref 6–20)
CALCIUM: 9.3 mg/dL (ref 8.9–10.3)
CO2: 25 mmol/L (ref 22–32)
CREATININE: 1.13 mg/dL (ref 0.61–1.24)
Chloride: 104 mmol/L (ref 101–111)
GFR calc Af Amer: >60 mL/min (ref >60)
GFR calc non Af Amer: >60 mL/min (ref >60)
GLUCOSE: 113 mg/dL — AB (ref 65–99)
Potassium: 4.7 mmol/L (ref 3.5–5.1)
Sodium: 136 mmol/L (ref 135–145)

## 2016-01-28 LAB — CBC
HCT: 46.7 % (ref 39.0–52.0)
HEMOGLOBIN: 15.8 g/dL (ref 13.0–17.0)
MCH: 29 pg (ref 26.0–34.0)
MCHC: 33.8 g/dL (ref 30.0–36.0)
MCV: 85.7 fL (ref 78.0–100.0)
PLATELETS: 187 10*3/uL (ref 150–400)
RBC: 5.45 MIL/uL (ref 4.22–5.81)
RDW: 14.9 % (ref 11.5–15.5)
WBC: 7.5 10*3/uL (ref 4.0–10.5)

## 2016-01-28 LAB — I-STAT TROPONIN, ED: TROPONIN I, POC: 0 ng/mL (ref 0.00–0.08)

## 2016-01-28 NOTE — ED Triage Notes (Signed)
Pt reports to the ED for eval of sudden onset of palpitation and heart fluttering sensation. He also had some associated CP and SOB. Pt hooked up to EKG which showed SVT at a rate of 180s-190s. IV started and patient self converted. Pt was alert and oriented throughout the episode. Pt hypertensive at 160s/100s. zoll pads applied.

## 2016-01-28 NOTE — ED Provider Notes (Signed)
[facility] DEPT Provider Note   CSN: [id number] Arrival date & time: 01/28/16  1340     History   Chief Complaint Chief Complaint  Patient presents with  . Tachycardia    HPI Chris Hunt is a 50 y.o. male.  50 yo M with a chief complaint of chest pain shortness of breath. This occurred while the patient was moving boxes from [place] at work. He gets very sweaty. He was having some abdominal pain this morning. When he arrived to the ED was noted to be in SVT. This resolved spontaneously when he had an IV placed. Patient is now asymptomatic. Denies any continued chest pain, shortness of breath, abdominal pain. Has never had this happen to him before.   The history is provided by the patient.  Illness  This is a new problem. The current episode started 3 to 5 hours ago. The problem occurs constantly. The problem has not changed since onset.Associated symptoms include chest pain, abdominal pain and shortness of breath. Pertinent negatives include no headaches. Nothing aggravates the symptoms. Nothing relieves the symptoms. He has tried nothing for the symptoms. The treatment provided no relief.    Past Medical History:  Diagnosis Date  . Allergy    Allergic rhinitis  . GERD (gastroesophageal reflux disease)   . Tobacco abuse     Patient Active Problem List   Diagnosis Date Noted  . Acute upper respiratory infection 01/17/2016  . Encounter for well adult exam with abnormal findings 01/13/2016  . Lumbar disc disease 01/13/2016  . History of gastric bypass 01/13/2016  . Hyperglycemia 01/13/2016  . Influenza-like syndrome 03/23/2012  . Foot pain, left 08/17/2011  . Lumbosacral strain 07/26/2011  . Right shoulder pain 01/06/2011  . Allergic rhinitis 01/05/2011  . Cough, persistent 11/09/2010  . TOBACCO USER 04/07/2008  . GERD 04/07/2008    Past Surgical History:  Procedure Laterality Date  . BACK SURGERY    . ESOPHAGUS SURGERY  84   " holes in esophagus"  .  LUMBAR LAMINECTOMY/DECOMPRESSION MICRODISCECTOMY N/A 05/04/2012   Procedure: LUMBAR LAMINECTOMY/DECOMPRESSION MICRODISCECTOMY 2 LEVELS;  Surgeon: Floyce Stakes, MD;  Location: [facility];  Service: Neurosurgery;  Laterality: N/A;  Bilateral Lumbar Five-Sacral One Diskectomy/Left Lumbar Four-Five Foraminotomy        Home Medications    Prior to Admission medications   Medication Sig Start Date End Date Taking? Authorizing Provider  cyclobenzaprine (FLEXERIL) 5 MG tablet Take 1 tablet (5 mg total) by mouth 3 (three) times daily as needed for muscle spasms. 01/13/16  Yes Biagio Borg, MD  omeprazole (PRILOSEC) 20 MG capsule Take 1 capsule (20 mg total) by mouth 2 (two) times daily as needed (acid reflux). 01/13/16  Yes Biagio Borg, MD  ibuprofen (ADVIL,MOTRIN) 800 MG tablet Take 1 tablet (800 mg total) by mouth 3 (three) times daily. Patient not taking: Reported on 01/28/2016 03/17/15   Lorayne Bender, PA-C    Family History Family History  Problem Relation Age of Onset  . Cancer Father     Social History Social History  Substance Use Topics  . Smoking status: Current Every Day Smoker    Packs/day: 1.00    Types: Cigarettes  . Smokeless tobacco: Never Used  . Alcohol use 7.2 oz/week    12 Cans of beer per week     Comment: 3-4 week/liquor     Allergies   Shellfish allergy and Lactose intolerance (gi)   Review of Systems Review of Systems  Constitutional:  Positive for diaphoresis. Negative for chills and fever.  HENT: Negative for congestion and facial swelling.   Eyes: Negative for discharge and visual disturbance.  Respiratory: Positive for shortness of breath.   Cardiovascular: Positive for chest pain. Negative for palpitations.  Gastrointestinal: Positive for abdominal pain and nausea. Negative for diarrhea and vomiting.  Musculoskeletal: Negative for arthralgias and myalgias.  Skin: Negative for color change and rash.  Neurological: Negative for tremors, syncope and  headaches.  Psychiatric/Behavioral: Negative for confusion and dysphoric mood.     Physical Exam Updated Vital Signs BP 131/96   Pulse 84   Temp 97.9 F (36.6 C) (Oral)   Resp 13   SpO2 99%   Physical Exam  Constitutional: He is oriented to person, place, and time. He appears well-developed and well-nourished.  HENT:  Head: Normocephalic and atraumatic.  Eyes: EOM are normal. Pupils are equal, round, and reactive to light.  Neck: Normal range of motion. Neck supple. No JVD present.  Cardiovascular: Normal rate and regular rhythm.  Exam reveals no gallop and no friction rub.   No murmur heard. Pulmonary/Chest: No respiratory distress. He has no wheezes.  Abdominal: He exhibits no distension and no mass. There is no tenderness. There is no rebound and no guarding.  Musculoskeletal: Normal range of motion.  Neurological: He is alert and oriented to person, place, and time.  Skin: No rash noted. No pallor.  Psychiatric: He has a normal mood and affect. His behavior is normal.  Nursing note and vitals reviewed.    ED Treatments / Results  Labs (all labs ordered are listed, but only abnormal results are displayed) Labs Reviewed  BASIC METABOLIC PANEL - Abnormal; Notable for the following:       Result Value   Glucose, Bld 113 (*)    All other components within normal limits  CBC  I-STAT TROPOININ, ED    EKG  EKG Interpretation  Date/Time:  Thursday January 28 2016 15:27:24 EST Ventricular Rate:  80 PR Interval:  160 QRS Duration: 105 QT Interval:  408 QTC Calculation: 471 R Axis:   -27 Text Interpretation:  Sinus rhythm Borderline left axis deviation ST elev, probable normal early repol pattern T wave improvement from prior Otherwise no significant change Confirmed by Mckenzi Buonomo MD, DANIEL [phone]) on 01/28/2016 3:44:46 PM       Radiology Dg Chest Portable 1 View  Result Date: 01/28/2016 CLINICAL DATA:  Chest pain EXAM: PORTABLE CHEST 1 VIEW COMPARISON:  03/17/2015  FINDINGS: EKG leads and defibrillator pads create artifact over the chest. Normal heart size and mediastinal contours. No acute infiltrate or edema. No effusion or pneumothorax. No acute osseous findings. IMPRESSION: Negative limited chest. Electronically Signed   By: Monte Fantasia M.D.   On: 01/28/2016 15:38    Procedures Procedures (including critical care time)  Medications Ordered in ED Medications - No data to display   Initial Impression / Assessment and Plan / ED Course  I have reviewed the triage vital signs and the nursing notes.  Pertinent labs & imaging results that were available during my care of the patient were reviewed by me and considered in my medical decision making (see chart for details).  Clinical Course     50 yo M With a chief complaint of SVT. This resolved spontaneously. Patient is now a symptomatically. He had labs that were unremarkable including a negative troponin. Repeat EKG had peaked T waves. Potassium is normal.  Will repeat ecg, obtain cxr.   ECG with  improvement, patient still asymptomatic.  D/c home with cards follow up.   3:58 PM:  I have discussed the diagnosis/risks/treatment options with the patient and family and believe the pt to be eligible for discharge home to follow-up with Cards, pcp. We also discussed returning to the ED immediately if new or worsening sx occur. We discussed the sx which are most concerning (e.g., sudden worsening pain, fever, inability to tolerate by mouth) that necessitate immediate return. Medications administered to the patient during their visit and any new prescriptions provided to the patient are listed below.  Medications given during this visit Medications - No data to display   The patient appears reasonably screen and/or stabilized for discharge and I doubt any other medical condition or other [facility] requiring further screening, evaluation, or treatment in the ED at this time prior to discharge.    Final  Clinical Impressions(s) / ED Diagnoses   Final diagnoses:  SVT (supraventricular tachycardia) ([place])    New Prescriptions New Prescriptions   No medications on file     Deno Etienne, DO 01/28/16 1558

## 2016-01-28 NOTE — ED Notes (Signed)
XR at bedside

## 2016-01-28 NOTE — Discharge Instructions (Signed)
Follow up with the cardiologist.

## 2016-02-01 ENCOUNTER — Encounter: Payer: Self-pay | Admitting: Cardiology

## 2016-02-01 ENCOUNTER — Ambulatory Visit (INDEPENDENT_AMBULATORY_CARE_PROVIDER_SITE_OTHER): Payer: 59 | Admitting: Cardiology

## 2016-02-01 VITALS — BP 126/92 | HR 68 | Ht 73.0 in | Wt 207.6 lb

## 2016-02-01 DIAGNOSIS — I471 Supraventricular tachycardia: Secondary | ICD-10-CM

## 2016-02-01 MED ORDER — METOPROLOL TARTRATE 25 MG PO TABS: 25.0000 mg | ORAL_TABLET | Freq: Two times a day (BID) | ORAL | 3 refills | Status: DC

## 2016-02-01 NOTE — Patient Instructions (Addendum)
Medication Instructions:   Your physician has recommended you make the following change in your medication:   1) START Metoprolol tartrate 25 mg twice a day  --- If you need a refill on your cardiac medications before your next appointment, please call your pharmacy. ---  Labwork:  None ordered  Testing/Procedures:  None ordered  Follow-Up:  Your physician recommends that you schedule a follow-up appointment in: 3 months with Dr. Curt Bears.   Thank you for choosing [facility]!!   Trinidad Curet, RN [phone]  Any Other Special Instructions Will Be Listed Below (If Applicable).  Metoprolol tablets What is this medicine? METOPROLOL (me TOE proe lole) is a beta-blocker. Beta-blockers reduce the workload on the heart and help it to beat more regularly. This medicine is used to treat high blood pressure and to prevent chest pain. It is also used to after a heart attack and to prevent an additional heart attack from occurring. This medicine may be used for other purposes; ask your health care provider or pharmacist if you have questions. COMMON BRAND NAME(S): Lopressor What should I tell my health care provider before I take this medicine? They need to know if you have any of these conditions: -diabetes -heart or vessel disease like slow heart rate, worsening heart failure, heart block, sick sinus syndrome or Raynaud's disease -kidney disease -liver disease -lung or breathing disease, like asthma or emphysema -pheochromocytoma -thyroid disease -an unusual or allergic reaction to metoprolol, other beta-blockers, medicines, foods, dyes, or preservatives -pregnant or trying to get pregnant -breast-feeding How should I use this medicine? Take this medicine by mouth with a drink of water. Follow the directions on the prescription label. Take this medicine immediately after meals. Take your doses at regular intervals. Do not take more medicine than directed. Do not stop taking  this medicine suddenly. This could lead to serious heart-related effects. Talk to your pediatrician regarding the use of this medicine in children. Special care may be needed. Overdosage: If you think you have taken too much of this medicine contact a poison control center or emergency room at once. NOTE: This medicine is only for you. Do not share this medicine with others. What if I miss a dose? If you miss a dose, take it as soon as you can. If it is almost time for your next dose, take only that dose. Do not take double or extra doses. What may interact with this medicine? This medicine may interact with the following medications: -certain medicines for blood pressure, heart disease, irregular heart beat -certain medicines for depression like monoamine oxidase (MAO) inhibitors, fluoxetine, or paroxetine -clonidine -dobutamine -epinephrine -isoproterenol -reserpine This list may not describe all possible interactions. Give your health care provider a list of all the medicines, herbs, non-prescription drugs, or dietary supplements you use. Also tell them if you smoke, drink alcohol, or use illegal drugs. Some items may interact with your medicine. What should I watch for while using this medicine? Visit your doctor or health care professional for regular check ups. Contact your doctor right away if your symptoms worsen. Check your blood pressure and pulse rate regularly. Ask your health care professional what your blood pressure and pulse rate should be, and when you should contact them. You may get drowsy or dizzy. Do not drive, use machinery, or do anything that needs mental alertness until you know how this medicine affects you. Do not sit or stand up quickly, especially if you are an older patient. This reduces the  risk of dizzy or fainting spells. Contact your doctor if these symptoms continue. Alcohol may interfere with the effect of this medicine. Avoid alcoholic drinks. What side effects  may I notice from receiving this medicine? Side effects that you should report to your doctor or health care professional as soon as possible: -allergic reactions like skin rash, itching or hives -cold or numb hands or feet -depression -difficulty breathing -faint -fever with sore throat -irregular heartbeat, chest pain -rapid weight gain -swollen legs or ankles Side effects that usually do not require medical attention (report to your doctor or health care professional if they continue or are bothersome): -anxiety or nervousness -change in sex drive or performance -dry skin -headache -nightmares or trouble sleeping -short term memory loss -stomach upset or diarrhea -unusually tired This list may not describe all possible side effects. Call your doctor for medical advice about side effects. You may report side effects to FDA at 1-800-FDA-1088. Where should I keep my medicine? Keep out of the reach of children. Store at room temperature between 15 and 30 degrees C (59 and 86 degrees F). Throw away any unused medicine after the expiration date. NOTE: This sheet is a summary. It may not cover all possible information. If you have questions about this medicine, talk to your doctor, pharmacist, or health care provider.  2017 Elsevier/Gold Standard (2012-10-19 14:40:36)

## 2016-02-01 NOTE — Progress Notes (Signed)
Electrophysiology Office Note   Date:  02/01/2016   ID:  Chris Hunt, DOB 20-Apr-1965, MRN [id number]  PCP:  Cathlean Cower, MD  Primary Electrophysiologist:  Will Meredith Leeds, MD    Chief Complaint  Patient presents with  . Consult    SVT     History of Present Illness: Chris Hunt is a 50 y.o. male who presents today for electrophysiology evaluation.   He put her emergency room with chest pain and shortness of breath. This occurred while he was moving boxes from [place] at work. When he arrived to the emergency room, he was found to be in SVT. SVT resolved spontaneously after an IV was placed. Since that time, he is felt well without any major complaints. He says that he has had no further issues with tachycardia.   Today, he denies symptoms of palpitations, chest pain, shortness of breath, orthopnea, PND, lower extremity edema, claudication, dizziness, presyncope, syncope, bleeding, or neurologic sequela. The patient is tolerating medications without difficulties and is otherwise without complaint today.    Past Medical History:  Diagnosis Date  . Allergy    Allergic rhinitis  . GERD (gastroesophageal reflux disease)   . Tobacco abuse    Past Surgical History:  Procedure Laterality Date  . BACK SURGERY    . ESOPHAGUS SURGERY  84   " holes in esophagus"  . LUMBAR LAMINECTOMY/DECOMPRESSION MICRODISCECTOMY N/A 05/04/2012   Procedure: LUMBAR LAMINECTOMY/DECOMPRESSION MICRODISCECTOMY 2 LEVELS;  Surgeon: Floyce Stakes, MD;  Location: [facility];  Service: Neurosurgery;  Laterality: N/A;  Bilateral Lumbar Five-Sacral One Diskectomy/Left Lumbar Four-Five Foraminotomy      Current Outpatient Prescriptions  Medication Sig Dispense Refill  . cyclobenzaprine (FLEXERIL) 5 MG tablet Take 1 tablet (5 mg total) by mouth 3 (three) times daily as needed for muscle spasms. 40 tablet 1  . ibuprofen (ADVIL,MOTRIN) 800 MG tablet Take 1 tablet (800 mg total) by mouth 3 (three) times  daily. 21 tablet 0  . omeprazole (PRILOSEC) 20 MG capsule Take 1 capsule (20 mg total) by mouth 2 (two) times daily as needed (acid reflux). 90 capsule 3  . metoprolol tartrate (LOPRESSOR) 25 MG tablet Take 1 tablet (25 mg total) by mouth 2 (two) times daily. 180 tablet 3   No current facility-administered medications for this visit.     Allergies:   Shellfish allergy and Lactose intolerance (gi)   Social History:  The patient  reports that he has been smoking Cigarettes.  He has been smoking about 1.00 pack per day. He has never used smokeless tobacco. He reports that he drinks about 7.2 oz of alcohol per week . He reports that he does not use drugs.   Family History:  The patient's family history includes Cancer in his father; Diabetes in his mother; Heart failure in his maternal grandmother; Hypertension in his mother.    ROS:  Please see the history of present illness.   Otherwise, review of systems is positive for none.   All other systems are reviewed and negative.    PHYSICAL EXAM: VS:  BP (!) 126/92   Pulse 68   Ht 6\' 1"  (1.854 m)   Wt 207 lb 9.6 oz (94.2 kg)   BMI 27.39 kg/m  , BMI Body mass index is 27.39 kg/m. GEN: Well nourished, well developed, in no acute distress  HEENT: normal  Neck: no JVD, carotid bruits, or masses Cardiac: RRR; no murmurs, rubs, or gallops,no edema  Respiratory:  clear to auscultation  bilaterally, normal work of breathing GI: soft, nontender, nondistended, + BS MS: no deformity or atrophy  Skin: warm and dry Neuro:  Strength and sensation are intact Psych: euthymic mood, full affect  EKG:  EKG is ordered today. Personal review of the ekg ordered 01/29/16 shows SVT  Recent Labs: 01/13/2016: ALT 12; TSH 0.57 01/28/2016: BUN 11; Creatinine, Ser 1.13; Hemoglobin 15.8; Platelets 187; Potassium 4.7; Sodium 136    Lipid Panel     Component Value Date/Time   CHOL 180 01/13/2016 1116   TRIG 72.0 01/13/2016 1116   HDL 75.00 01/13/2016 1116    CHOLHDL 2 01/13/2016 1116   VLDL 14.4 01/13/2016 1116   LDLCALC 91 01/13/2016 1116     Wt Readings from Last 3 Encounters:  02/01/16 207 lb 9.6 oz (94.2 kg)  01/13/16 203 lb (92.1 kg)  01/30/15 208 lb (94.3 kg)      Other studies Reviewed: Additional studies/ records that were reviewed today include: ER notes   ASSESSMENT AND PLAN:  1.  SVT: Had an air, tachycardia and presented to the emergency room which broke with an IV placement. Due to the mechanism, it is likely that the tachycardia broke with vagal maneuvers. 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 I discussed with him risks and benefits of ablation. Risks include bleeding, tamponade, heart block, and stroke, among others. At this time, he would like to avoid procedures. We'll start him on metoprolol today. I also reviewed to vagal maneuvers for him in case he goes back into SVT.    Current medicines are reviewed at length with the patient today.   The patient does not have concerns regarding his medicines.  The following changes were made today:  metoprolol  Labs/ tests ordered today include:  No orders of the defined types were placed in this encounter.    Disposition:   FU with Will Camnitz 3 months  Signed, Will Meredith Leeds, MD  02/01/2016 11:53 AM     [facility] [address] [place] [address] [phone] (office) [phone] (fax)

## 2016-02-05 ENCOUNTER — Ambulatory Visit (INDEPENDENT_AMBULATORY_CARE_PROVIDER_SITE_OTHER): Payer: 59 | Admitting: Internal Medicine

## 2016-02-05 ENCOUNTER — Encounter: Payer: Self-pay | Admitting: Internal Medicine

## 2016-02-05 VITALS — BP 138/78 | HR 62 | Resp 20 | Wt 207.0 lb

## 2016-02-05 DIAGNOSIS — M79645 Pain in left finger(s): Secondary | ICD-10-CM | POA: Diagnosis not present

## 2016-02-05 DIAGNOSIS — I471 Supraventricular tachycardia, unspecified: Secondary | ICD-10-CM | POA: Insufficient documentation

## 2016-02-05 DIAGNOSIS — R079 Chest pain, unspecified: Secondary | ICD-10-CM

## 2016-02-05 DIAGNOSIS — R739 Hyperglycemia, unspecified: Secondary | ICD-10-CM | POA: Diagnosis not present

## 2016-02-05 MED ORDER — DICLOFENAC SODIUM 1 % TD GEL: 4.0000 g | Freq: Four times a day (QID) | TRANSDERMAL | 11 refills | Status: DC | PRN

## 2016-02-05 NOTE — Patient Instructions (Addendum)
Your A1c was OK today  Please take all new medication as prescribed - the voltaren gel for joint pain  Please continue all other medications as before, and refills have been done if requested.  Please have the pharmacy call with any other refills you may need.  Please continue your efforts at being more active, low cholesterol diet, and weight control.  Please keep your appointments with your specialists as you may have planned  Please return in 6 months, or sooner if needed

## 2016-02-05 NOTE — Progress Notes (Signed)
Pre visit review using our clinic review tool, if applicable. No additional management support is needed unless otherwise documented below in the visit note. 

## 2016-02-05 NOTE — Progress Notes (Signed)
Subjective:    Patient ID: Chris Hunt, male    DOB: 08-May-1965, 51 y.o.   MRN: [id number]  HPI  Here to f/u recent ED visit nov 30, presented with SOB, found to have SVT which resolved spontaneously.  Pt o/w denied CP except for occasionally fleeting sharp pain o/w not assoc with palps, diaphoresis, n/v.  No prior hx.  Labs and cxr documented on EMR neg for acute.  Pt tx with new BB therapy to which he has tolerated well.   Has been seen per cardiology, and has deferred any ablation therapy/EP study.  Since pt has done well and tolaterated tx, Pt denies chest pain, increased sob or doe, wheezing, orthopnea, PND, increased LE swelling, palpitations, dizziness or syncope. Pt denies new neurological symptoms such as new headache, or facial or extremity weakness or numbness   Pt denies polydipsia, polyuria, and is a bit surprised his sugar was slightly up recently.  Also has had 2 wks mild intermittent dull pain at the left hand CMC joint without swelling or trauma, but tender to touch and flex. Past Medical History:  Diagnosis Date  . Allergy    Allergic rhinitis  . GERD (gastroesophageal reflux disease)   . Tobacco abuse    Past Surgical History:  Procedure Laterality Date  . BACK SURGERY    . ESOPHAGUS SURGERY  84   " holes in esophagus"  . LUMBAR LAMINECTOMY/DECOMPRESSION MICRODISCECTOMY N/A 05/04/2012   Procedure: LUMBAR LAMINECTOMY/DECOMPRESSION MICRODISCECTOMY 2 LEVELS;  Surgeon: Floyce Stakes, MD;  Location: [facility];  Service: Neurosurgery;  Laterality: N/A;  Bilateral Lumbar Five-Sacral One Diskectomy/Left Lumbar Four-Five Foraminotomy     reports that he has been smoking Cigarettes.  He has been smoking about 1.00 pack per day. He has never used smokeless tobacco. He reports that he drinks about 7.2 oz of alcohol per week . He reports that he does not use drugs. family history includes Cancer in his father; Diabetes in his mother; Heart failure in his maternal grandmother;  Hypertension in his mother. Allergies  Allergen Reactions  . Shellfish Allergy Anaphylaxis  . Lactose Intolerance (Gi) Diarrhea   Current Outpatient Prescriptions on File Prior to Visit  Medication Sig Dispense Refill  . cyclobenzaprine (FLEXERIL) 5 MG tablet Take 1 tablet (5 mg total) by mouth 3 (three) times daily as needed for muscle spasms. 40 tablet 1  . ibuprofen (ADVIL,MOTRIN) 800 MG tablet Take 1 tablet (800 mg total) by mouth 3 (three) times daily. 21 tablet 0  . metoprolol tartrate (LOPRESSOR) 25 MG tablet Take 1 tablet (25 mg total) by mouth 2 (two) times daily. 180 tablet 3  . omeprazole (PRILOSEC) 20 MG capsule Take 1 capsule (20 mg total) by mouth 2 (two) times daily as needed (acid reflux). 90 capsule 3   No current facility-administered medications on file prior to visit.    Review of Systems  Constitutional: Negative for unusual diaphoresis or night sweats HENT: Negative for ear swelling or discharge Eyes: Negative for worsening visual haziness  Respiratory: Negative for choking and stridor.   Gastrointestinal: Negative for distension or worsening eructation Genitourinary: Negative for retention or change in urine volume.  Musculoskeletal: Negative for other MSK pain or swelling Skin: Negative for color change and worsening wound Neurological: Negative for tremors and numbness other than noted  Psychiatric/Behavioral: Negative for decreased concentration or agitation other than above   All other system neg per pt    Objective:   Physical Exam BP 138/78  Pulse 62   Resp 20   Wt 207 lb (93.9 kg)   SpO2 97%   BMI 27.31 kg/m  VS noted,  Constitutional: Pt appears in no apparent distress HENT: Head: NCAT.  Right Ear: External ear normal.  Left Ear: External ear normal.  Eyes: . Pupils are equal, round, and reactive to light. Conjunctivae and EOM are normal Neck: Normal range of motion. Neck supple.  Cardiovascular: Normal rate and regular rhythm.     Pulmonary/Chest: Effort normal and breath sounds without rales or wheezing.  Abd:  Soft, NT, ND, + BS Left thumb CMC joint mild tender with some bony enlargement but no effusion, has FROM Neurological: Pt is alert. Not confused , motor grossly intact Skin: Skin is warm. No rash, no LE edema Psychiatric: Pt behavior is normal. No agitation.     Assessment & Plan:

## 2016-02-06 NOTE — Assessment & Plan Note (Signed)
stable overall by history and exam, and pt to continue medical treatment as before,  to f/u any worsening symptoms or concerns 

## 2016-02-06 NOTE — Assessment & Plan Note (Signed)
Atypical, very unlikely cardiac, now resolved it seems, ? MSK, to cont to monitor

## 2016-02-06 NOTE — Assessment & Plan Note (Signed)
Asympt, mild,  Lab Results  Component Value Date   HGBA1C 5.9 01/13/2016   stable overall by history and exam, recent data reviewed with pt, and pt to continue medical treatment as before,  to f/u any worsening symptoms or concerns

## 2016-02-06 NOTE — Assessment & Plan Note (Signed)
C/w likely underlying overuse or DJD, for volt gel topical prn,  to f/u any worsening symptoms or concerns

## 2016-05-16 ENCOUNTER — Ambulatory Visit: Payer: 59 | Admitting: Cardiology

## 2016-05-30 ENCOUNTER — Encounter: Payer: Self-pay | Admitting: Cardiology

## 2016-05-30 ENCOUNTER — Ambulatory Visit (INDEPENDENT_AMBULATORY_CARE_PROVIDER_SITE_OTHER): Payer: 59 | Admitting: Cardiology

## 2016-05-30 VITALS — BP 160/120 | HR 64 | Ht 73.0 in | Wt 204.6 lb

## 2016-05-30 DIAGNOSIS — I471 Supraventricular tachycardia: Secondary | ICD-10-CM | POA: Diagnosis not present

## 2016-05-30 NOTE — Progress Notes (Signed)
Electrophysiology Office Note   Date:  05/30/2016   ID:  RANIER COACH, DOB 11/18/1965, MRN [id number]  PCP:  Cathlean Cower, MD  Primary Electrophysiologist:  Haylea Schlichting Meredith Leeds, MD    No chief complaint on file.    History of Present Illness: Chris Hunt is a 51 y.o. male who presents today for electrophysiology evaluation.   He put her emergency room with chest pain and shortness of breath. This occurred while he was moving boxes from [place] at work. When he arrived to the emergency room, he was found to be in SVT. SVT resolved spontaneously after an IV was placed. Since that time, he is felt well without any major complaints. He has had no further issues with tachycardia. He has been having left arm and shoulder pain. He says that he woke up 2 weeks ago with pain in this area. His discomfort is worse when he moves his arm around and improves when he rests his arm. It is not associated with exertion nor is it improved with rest.   Today, he denies symptoms of palpitations, shortness of breath, orthopnea, PND, lower extremity edema, claudication, dizziness, presyncope, syncope, bleeding, or neurologic sequela. The patient is tolerating medications without difficulties and is otherwise without complaint today.    Past Medical History:  Diagnosis Date  . Allergy    Allergic rhinitis  . GERD (gastroesophageal reflux disease)   . Tobacco abuse    Past Surgical History:  Procedure Laterality Date  . BACK SURGERY    . ESOPHAGUS SURGERY  84   " holes in esophagus"  . LUMBAR LAMINECTOMY/DECOMPRESSION MICRODISCECTOMY N/A 05/04/2012   Procedure: LUMBAR LAMINECTOMY/DECOMPRESSION MICRODISCECTOMY 2 LEVELS;  Surgeon: Floyce Stakes, MD;  Location: [facility];  Service: Neurosurgery;  Laterality: N/A;  Bilateral Lumbar Five-Sacral One Diskectomy/Left Lumbar Four-Five Foraminotomy      Current Outpatient Prescriptions  Medication Sig Dispense Refill  . cyclobenzaprine (FLEXERIL) 5 MG  tablet Take 1 tablet (5 mg total) by mouth 3 (three) times daily as needed for muscle spasms. 40 tablet 1  . diclofenac sodium (VOLTAREN) 1 % GEL Apply 4 g topically 4 (four) times daily as needed. 6 g 11  . ibuprofen (ADVIL,MOTRIN) 800 MG tablet Take 1 tablet (800 mg total) by mouth 3 (three) times daily. 21 tablet 0  . metoprolol tartrate (LOPRESSOR) 25 MG tablet Take 1 tablet (25 mg total) by mouth 2 (two) times daily. 180 tablet 3  . omeprazole (PRILOSEC) 20 MG capsule Take 1 capsule (20 mg total) by mouth 2 (two) times daily as needed (acid reflux). 90 capsule 3   No current facility-administered medications for this visit.     Allergies:   Shellfish allergy and Lactose intolerance (gi)   Social History:  The patient  reports that he has been smoking Cigarettes.  He has been smoking about 1.00 pack per day. He has never used smokeless tobacco. He reports that he drinks about 7.2 oz of alcohol per week . He reports that he does not use drugs.   Family History:  The patient's family history includes Cancer in his father; Diabetes in his mother; Heart failure in his maternal grandmother; Hypertension in his mother.    ROS:  Please see the history of present illness.   Otherwise, review of systems is positive for arm numbness, muscle pain, headache.   All other systems are reviewed and negative.    PHYSICAL EXAM: VS:  BP (!) 160/120 (BP Location: Left Arm, Patient  Position: Sitting, Cuff Size: Normal)   Pulse 64   Ht 6\' 1"  (1.854 m)   Wt 204 lb 9.6 oz (92.8 kg)   SpO2 97%   BMI 26.99 kg/m  , BMI Body mass index is 26.99 kg/m. GEN: Well nourished, well developed, in no acute distress  HEENT: normal  Neck: no JVD, carotid bruits, or masses Cardiac: RRR; no murmurs, rubs, or gallops,no edema  Respiratory:  clear to auscultation bilaterally, normal work of breathing GI: soft, nontender, nondistended, + BS MS: no deformity or atrophy  Skin: warm and dry Neuro:  Strength and sensation  are intact Psych: euthymic mood, full affect  EKG:  EKG is not ordered today. Personal review of the ekg ordered 01/29/16 shows SVT  Recent Labs: 01/13/2016: ALT 12; TSH 0.57 01/28/2016: BUN 11; Creatinine, Ser 1.13; Hemoglobin 15.8; Platelets 187; Potassium 4.7; Sodium 136    Lipid Panel     Component Value Date/Time   CHOL 180 01/13/2016 1116   TRIG 72.0 01/13/2016 1116   HDL 75.00 01/13/2016 1116   CHOLHDL 2 01/13/2016 1116   VLDL 14.4 01/13/2016 1116   LDLCALC 91 01/13/2016 1116     Wt Readings from Last 3 Encounters:  05/30/16 204 lb 9.6 oz (92.8 kg)  02/05/16 207 lb (93.9 kg)  02/01/16 207 lb 9.6 oz (94.2 kg)      Other studies Reviewed: Additional studies/ records that were reviewed today include: ER notes   ASSESSMENT AND PLAN:  1.  SVT: Had tachycardia and presented to the emergency room which broke with an IV placement. Due to the mechanism, it is likely that the tachycardia broke with vagal maneuvers. 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 He continues to want to avoid procedures as he is felt well on the metoprolol alone. We'll continue this at the current dose. His blood pressure is elevated today, but it is possibly due to pain in his arm. We'll continue to monitor.  2. Chest and arm pain: Pain is worse with movement and reproducible with palpation. Unlikely to be cardiac in nature. He does have follow-up with his primary physician tomorrow.    Current medicines are reviewed at length with the patient today.   The patient does not have concerns regarding his medicines.  The following changes were made today:  none  Labs/ tests ordered today include:  No orders of the defined types were placed in this encounter.    Disposition:   FU with Stepan Verrette 6 months  Signed, Travas Schexnayder Meredith Leeds, MD  05/30/2016 4:05 PM     [place] [address] [place]  [phone] (office) [phone] (fax)

## 2016-05-30 NOTE — Patient Instructions (Signed)
Medication Instructions:  Your physician recommends that you continue on your current medications as directed. Please refer to the Current Medication list given to you today.  If you need a refill on your cardiac medications before your next appointment, please call your pharmacy.   Labwork: None ordered  Testing/Procedures: None ordered  Follow-Up: Your physician wants you to follow-up in: 6 months with Dr. Camnitz.  You will receive a reminder letter in the mail two months in advance. If you don't receive a letter, please call our office to schedule the follow-up appointment.  Thank you for choosing [facility]!!   Bruin Bolger, RN [phone]         

## 2016-05-31 ENCOUNTER — Encounter: Payer: Self-pay | Admitting: Internal Medicine

## 2016-05-31 ENCOUNTER — Ambulatory Visit (INDEPENDENT_AMBULATORY_CARE_PROVIDER_SITE_OTHER): Payer: 59 | Admitting: Internal Medicine

## 2016-05-31 VITALS — BP 142/90 | HR 68 | Temp 98.4°F | Resp 12 | Ht 73.0 in | Wt 202.0 lb

## 2016-05-31 DIAGNOSIS — I10 Essential (primary) hypertension: Secondary | ICD-10-CM

## 2016-05-31 DIAGNOSIS — K219 Gastro-esophageal reflux disease without esophagitis: Secondary | ICD-10-CM | POA: Diagnosis not present

## 2016-05-31 DIAGNOSIS — M5412 Radiculopathy, cervical region: Secondary | ICD-10-CM

## 2016-05-31 DIAGNOSIS — J3089 Other allergic rhinitis: Secondary | ICD-10-CM

## 2016-05-31 MED ORDER — PANTOPRAZOLE SODIUM 40 MG PO TBEC: 40.0000 mg | DELAYED_RELEASE_TABLET | Freq: Every day | ORAL | 3 refills | Status: DC

## 2016-05-31 MED ORDER — GABAPENTIN 300 MG PO CAPS: 300.0000 mg | ORAL_CAPSULE | Freq: Three times a day (TID) | ORAL | 3 refills | Status: DC

## 2016-05-31 MED ORDER — GABAPENTIN 100 MG PO CAPS: ORAL_CAPSULE | ORAL | 0 refills | Status: DC

## 2016-05-31 MED ORDER — PREDNISONE 10 MG PO TABS: ORAL_TABLET | ORAL | 0 refills | Status: DC

## 2016-05-31 MED ORDER — METHYLPREDNISOLONE ACETATE 80 MG/ML IJ SUSP
80.0000 mg | Freq: Once | INTRAMUSCULAR | Status: AC
  Administered 2016-05-31: 80 mg via INTRAMUSCULAR

## 2016-05-31 MED ORDER — TRAMADOL HCL 50 MG PO TABS: 50.0000 mg | ORAL_TABLET | Freq: Three times a day (TID) | ORAL | 1 refills | Status: DC | PRN

## 2016-05-31 MED ORDER — METOPROLOL TARTRATE 50 MG PO TABS: 50.0000 mg | ORAL_TABLET | Freq: Two times a day (BID) | ORAL | 3 refills | Status: DC

## 2016-05-31 MED ORDER — CETIRIZINE HCL 10 MG PO TABS: 10.0000 mg | ORAL_TABLET | Freq: Every day | ORAL | 11 refills | Status: DC

## 2016-05-31 NOTE — Progress Notes (Signed)
Subjective:    Patient ID: Chris Hunt, male    DOB: [birth year], 51 y.o.   MRN: [id number]  HPI  Here with 1 mo onset severe left neck pain with radiation to left hand, assoc with tingling and mild reduced grip strength.  Also, Does have several wks ongoing nasal allergy symptoms with clearish congestion, itch and sneezing, without fever, pain, ST, cough, swelling or wheezing. Also with mild worsening reflux for several months but no abd pain, dysphagia, n/v, bowel change or blood. Also BP not well controlled.  Pt denies chest pain, increased sob or doe, wheezing, orthopnea, PND, increased LE swelling, palpitations, dizziness or syncope.  BP Readings from Last 3 Encounters:  05/31/16 (!) 142/90  05/30/16 (!) 160/120  02/05/16 138/78   Past Medical History:  Diagnosis Date  . Allergy    Allergic rhinitis  . GERD (gastroesophageal reflux disease)   . Tobacco abuse    Past Surgical History:  Procedure Laterality Date  . BACK SURGERY    . ESOPHAGUS SURGERY  84   " holes in esophagus"  . LUMBAR LAMINECTOMY/DECOMPRESSION MICRODISCECTOMY N/A 05/04/2012   Procedure: LUMBAR LAMINECTOMY/DECOMPRESSION MICRODISCECTOMY 2 LEVELS;  Surgeon: Floyce Stakes, MD;  Location: [facility];  Service: Neurosurgery;  Laterality: N/A;  Bilateral Lumbar Five-Sacral One Diskectomy/Left Lumbar Four-Five Foraminotomy     reports that he has been smoking Cigarettes.  He has been smoking about 1.00 pack per day. He has never used smokeless tobacco. He reports that he drinks about 7.2 oz of alcohol per week . He reports that he does not use drugs. family history includes Cancer in his father; Diabetes in his mother; Heart failure in his maternal grandmother; Hypertension in his mother. Allergies  Allergen Reactions  . Shellfish Allergy Anaphylaxis  . Lactose Intolerance (Gi) Diarrhea   Current Outpatient Prescriptions on File Prior to Visit  Medication Sig Dispense Refill  . cyclobenzaprine (FLEXERIL) 5 MG  tablet Take 1 tablet (5 mg total) by mouth 3 (three) times daily as needed for muscle spasms. 40 tablet 1  . diclofenac sodium (VOLTAREN) 1 % GEL Apply 4 g topically 4 (four) times daily as needed. 6 g 11  . ibuprofen (ADVIL,MOTRIN) 800 MG tablet Take 1 tablet (800 mg total) by mouth 3 (three) times daily. 21 tablet 0   No current facility-administered medications on file prior to visit.    Review of Systems  Constitutional: Negative for other unusual diaphoresis or sweats HENT: Negative for ear discharge or swelling Eyes: Negative for other worsening visual disturbances Respiratory: Negative for stridor or other swelling  Gastrointestinal: Negative for worsening distension or other blood Genitourinary: Negative for retention or other urinary change Musculoskeletal: Negative for other MSK pain or swelling Skin: Negative for color change or other new lesions Neurological: Negative for worsening tremors and other numbness  Psychiatric/Behavioral: Negative for worsening agitation or other fatigue All other system neg per pt    Objective:   Physical Exam BP (!) 142/90 (BP Location: Left Arm, Patient Position: Sitting, Cuff Size: Normal)   Pulse 68   Temp 98.4 F (36.9 C) (Oral)   Resp 12   Ht 6\' 1"  (1.854 m)   Wt 202 lb (91.6 kg)   SpO2 99%   BMI 26.65 kg/m  VS noted,  Constitutional: Pt appears in NAD HENT: Head: NCAT.  Right Ear: External ear normal.  Left Ear: External ear normal.  Eyes: . Pupils are equal, round, and reactive to light. Conjunctivae and EOM are  normal Nose: without d/c or deformity Neck: Neck supple. Gross normal ROM Cardiovascular: Normal rate and regular rhythm.   Pulmonary/Chest: Effort normal and breath sounds without rales or wheezing.  Abd:  Soft, NT, ND, + BS, no organomegaly Neurological: Pt is alert. At baseline orientation, motor grossly intact except for decreased sens to LT and 4+ 5 LUE weakness Skin: Skin is warm. No rashes, other new lesions,  no LE edema Psychiatric: Pt behavior is normal without agitation  No other exam findings.    Assessment & Plan:

## 2016-05-31 NOTE — Patient Instructions (Signed)
You had the steroid shot today  Please take all new medication as prescribed - the pain medication (the tramadol), prednisone, zyrtec, gabapentin, and protonix 40 mg per day  OK to increase the metoprolol to 50 mg twice per day for better blood pressure  Please continue all other medications as before, and refills have been done if requested.  Please have the pharmacy call with any other refills you may need.  Please keep your appointments with your specialists as you may have planned  You will be contacted regarding the referral for: MRI for the neck, as well as Neurosurgury for further consideration after the MRI

## 2016-05-31 NOTE — Progress Notes (Signed)
Pre visit review using our clinic review tool, if applicable. No additional management support is needed unless otherwise documented below in the visit note. 

## 2016-06-06 DIAGNOSIS — J3089 Other allergic rhinitis: Secondary | ICD-10-CM | POA: Insufficient documentation

## 2016-06-06 DIAGNOSIS — M5412 Radiculopathy, cervical region: Secondary | ICD-10-CM | POA: Insufficient documentation

## 2016-06-06 DIAGNOSIS — I10 Essential (primary) hypertension: Secondary | ICD-10-CM | POA: Insufficient documentation

## 2016-06-06 NOTE — Assessment & Plan Note (Signed)
Moderate, for pain control, gabapentin asd, and MRI cervical, consider surgical refrral

## 2016-06-06 NOTE — Assessment & Plan Note (Signed)
Mild uncontrolled with otc prilosec, to change to protonix 40

## 2016-06-06 NOTE — Assessment & Plan Note (Signed)
Mild uncontrolled, to increase the metoprolol to 50 bid

## 2016-06-06 NOTE — Assessment & Plan Note (Signed)
With seasonal flare mild to mod, for depomedrol IM, , predpac asd, zyrtec asd,  to f/u any worsening symptoms or concerns

## 2016-06-11 ENCOUNTER — Ambulatory Visit
Admission: RE | Admit: 2016-06-11 | Discharge: 2016-06-11 | Disposition: A | Discharge status: Home / Self Care | Payer: 59 | Source: Ambulatory Visit | Attending: Internal Medicine | Admitting: Internal Medicine

## 2016-06-11 DIAGNOSIS — M5412 Radiculopathy, cervical region: Secondary | ICD-10-CM

## 2016-06-12 ENCOUNTER — Encounter: Payer: Self-pay | Admitting: Internal Medicine

## 2016-06-12 ENCOUNTER — Ambulatory Visit (HOSPITAL_COMMUNITY)
Admission: EM | Admit: 2016-06-12 | Discharge: 2016-06-12 | Disposition: A | Discharge status: Home / Self Care | Payer: 59 | Attending: Internal Medicine | Admitting: Internal Medicine

## 2016-06-12 ENCOUNTER — Encounter (HOSPITAL_COMMUNITY): Payer: Self-pay | Admitting: Emergency Medicine

## 2016-06-12 DIAGNOSIS — M25512 Pain in left shoulder: Secondary | ICD-10-CM | POA: Diagnosis not present

## 2016-06-12 DIAGNOSIS — M5412 Radiculopathy, cervical region: Secondary | ICD-10-CM | POA: Diagnosis not present

## 2016-06-12 MED ORDER — PREDNISONE 10 MG (21) PO TBPK: ORAL_TABLET | ORAL | 0 refills | Status: DC

## 2016-06-12 MED ORDER — METHYLPREDNISOLONE ACETATE 80 MG/ML IJ SUSP
INTRAMUSCULAR | Status: AC
  Filled 2016-06-12: qty 1

## 2016-06-12 MED ORDER — KETOROLAC TROMETHAMINE 60 MG/2ML IM SOLN
60.0000 mg | Freq: Once | INTRAMUSCULAR | Status: AC
  Administered 2016-06-12: 60 mg via INTRAMUSCULAR

## 2016-06-12 MED ORDER — METHYLPREDNISOLONE ACETATE 80 MG/ML IJ SUSP
80.0000 mg | Freq: Once | INTRAMUSCULAR | Status: AC
  Administered 2016-06-12: 80 mg via INTRAMUSCULAR

## 2016-06-12 MED ORDER — KETOROLAC TROMETHAMINE 60 MG/2ML IM SOLN
INTRAMUSCULAR | Status: AC
  Filled 2016-06-12: qty 2

## 2016-06-12 NOTE — ED Triage Notes (Addendum)
Unable to turn head to the left, pain in left neck, shoulder and left arm including middle and index finger This is chronic pain, had mri yesterday ordered by [place] provider

## 2016-06-12 NOTE — ED Provider Notes (Signed)
CSN: [id number]     Arrival date & time 06/12/16  1655 History   First MD Initiated Contact with Patient 06/12/16 1801     Chief Complaint  Patient presents with  . Shoulder Pain   (Consider location/radiation/quality/duration/timing/severity/associated sxs/prior Treatment) 51 year old male presents to clinic for evaluation of of left shoulder pain is been ongoing for more than one month. He has been diagnosed with cervical radiculopathy, and had an MRI done this week. He has not yet seen his primary care provider for results of his MRI. His pain is being treated with gabapentin. He states the gabapentin has worked, but hydrocodone works better and he wants some pain medicine.   The history is provided by the patient.  Shoulder Pain  Location:  Shoulder Shoulder location:  L shoulder Injury: no   Pain details:    Quality:  Tingling and throbbing   Radiates to:  L elbow, L forearm and L fingers   Severity:  Mild   Onset quality:  Gradual   Duration:  1 month   Timing:  Constant   Progression:  Unchanged Handedness:  Right-handed Dislocation: no   Relieved by:  Narcotics (Gabapentin) Worsened by:  Movement Associated symptoms: tingling   Associated symptoms: no fatigue, no fever, no muscle weakness, no neck pain, no numbness and no swelling     Past Medical History:  Diagnosis Date  . Allergy    Allergic rhinitis  . GERD (gastroesophageal reflux disease)   . Tobacco abuse    Past Surgical History:  Procedure Laterality Date  . BACK SURGERY    . ESOPHAGUS SURGERY  84   " holes in esophagus"  . LUMBAR LAMINECTOMY/DECOMPRESSION MICRODISCECTOMY N/A 05/04/2012   Procedure: LUMBAR LAMINECTOMY/DECOMPRESSION MICRODISCECTOMY 2 LEVELS;  Surgeon: Floyce Stakes, MD;  Location: [place] [facility];  Service: Neurosurgery;  Laterality: N/A;  Bilateral Lumbar Five-Sacral One Diskectomy/Left Lumbar Four-Five Foraminotomy    Family History  Problem Relation Age of Onset  . Cancer Father   .  Diabetes Mother   . Hypertension Mother   . Heart failure Maternal Grandmother    Social History  Substance Use Topics  . Smoking status: Current Every Day Smoker    Packs/day: 1.00    Types: Cigarettes  . Smokeless tobacco: Never Used  . Alcohol use 7.2 oz/week    12 Cans of beer per week     Comment: 3-4 week/liquor    Review of Systems  Constitutional: Negative for fatigue and fever.  HENT: Negative.   Respiratory: Negative.   Cardiovascular: Negative.   Gastrointestinal: Negative.   Genitourinary: Negative.   Musculoskeletal: Negative for neck pain and neck stiffness.  Skin: Negative.   Neurological: Negative.     Allergies  Shellfish allergy and Lactose intolerance (gi)  Home Medications   Prior to Admission medications   Medication Sig Start Date End Date Taking? Authorizing Provider  gabapentin (NEURONTIN) 100 MG capsule 1 tab by mouth three times per day for 1 wk 05/31/16  Yes Biagio Borg, MD  gabapentin (NEURONTIN) 300 MG capsule Take 1 capsule (300 mg total) by mouth 3 (three) times daily. To start after the initial week of 100 mg three times per day for 1 wk 05/31/16  Yes Biagio Borg, MD  metoprolol (LOPRESSOR) 50 MG tablet Take 1 tablet (50 mg total) by mouth 2 (two) times daily. 05/31/16  Yes Biagio Borg, MD  pantoprazole (PROTONIX) 40 MG tablet Take 1 tablet (40 mg total) by mouth daily. 05/31/16  Yes Biagio Borg, MD  cetirizine (ZYRTEC) 10 MG tablet Take 1 tablet (10 mg total) by mouth daily. 05/31/16 05/31/17  Biagio Borg, MD  cyclobenzaprine (FLEXERIL) 5 MG tablet Take 1 tablet (5 mg total) by mouth 3 (three) times daily as needed for muscle spasms. 01/13/16   Biagio Borg, MD  diclofenac sodium (VOLTAREN) 1 % GEL Apply 4 g topically 4 (four) times daily as needed. 02/05/16   Biagio Borg, MD  ibuprofen (ADVIL,MOTRIN) 800 MG tablet Take 1 tablet (800 mg total) by mouth 3 (three) times daily. 03/17/15   Shawn C Joy, PA-C  predniSONE (STERAPRED UNI-PAK 21 TAB) 10 MG  (21) TBPK tablet Take 6 tablets tomorrow, decrease by 1 each day till finished (6,5,4,3,2,1) 06/12/16   Barnet Glasgow, NP  traMADol (ULTRAM) 50 MG tablet Take 1 tablet (50 mg total) by mouth every 8 (eight) hours as needed. 05/31/16   Biagio Borg, MD   Meds Ordered and Administered this Visit   Medications  ketorolac (TORADOL) injection 60 mg (60 mg Intramuscular Given 06/12/16 1823)  methylPREDNISolone acetate (DEPO-MEDROL) injection 80 mg (80 mg Intramuscular Given 06/12/16 1822)    BP (!) 144/95 (BP Location: Right Arm)   Pulse 65   Temp 98.2 F (36.8 C) (Oral)   Resp 16   SpO2 98%  No data found.   Physical Exam  Constitutional: He is oriented to person, place, and time. He appears well-developed and well-nourished. No distress.  HENT:  Head: Normocephalic and atraumatic.  Right Ear: External ear normal.  Left Ear: External ear normal.  Neck: Normal range of motion. Neck supple.  Musculoskeletal:  Full range of motion around the shoulder, no crepitus deformity felt, no apparent edema or swelling noted to the joint.  Neurological: He is alert and oriented to person, place, and time.  Skin: Skin is warm and dry. Capillary refill takes less than 2 seconds. He is not diaphoretic.  Psychiatric: He has a normal mood and affect. His behavior is normal.  Nursing note and vitals reviewed.   Urgent Care Course     Procedures (including critical care time)  Labs Review Labs Reviewed - No data to display  Imaging Review Mr Cervical Spine Wo Contrast  Result Date: 06/11/2016 CLINICAL DATA:  Left-sided neck pain radiating to left upper extremity EXAM: MRI CERVICAL SPINE WITHOUT CONTRAST TECHNIQUE: Multiplanar, multisequence MR imaging of the cervical spine was performed. No intravenous contrast was administered. COMPARISON:  None. FINDINGS: Alignment: Normal. Vertebrae: No acute compression fracture, discitis-osteomyelitis, facet edema or other focal marrow lesion. No epidural  collection. Cord: Normal caliber and signal. Posterior Fossa, vertebral arteries, paraspinal tissues: Visualized posterior fossa is normal. Vertebral artery flow voids are preserved. Normal visualized paraspinal soft tissues. Disc levels: C1-C2: Normal. C2-C3: Normal disc space and facets. No spinal canal or neuroforaminal stenosis. C3-C4: Normal disc space and facets. No spinal canal or neuroforaminal stenosis. C4-C5: Small central disc protrusion narrowing the ventral thecal sac. No spinal canal or neural foraminal stenosis. C5-C6: Small central disc protrusion with very small annular fissure. No spinal canal or neural foraminal stenosis. C6-C7: Predominantly left foraminal disc osteophyte complex causes severe left neural foraminal stenosis. No spinal canal stenosis. C7-T1: Normal disc space and facets. No spinal canal or neuroforaminal stenosis. IMPRESSION: 1. C6-C7 is the likely symptomatic level, where there is a left foraminal disc osteophyte complex causing severe left neural foraminal stenosis. 2. No other foraminal or spinal canal stenosis. Electronically Signed   By: Lennette Bihari  [place] M.D.   On: 06/11/2016 22:17       MDM   1. Cervical radiculopathy    MRI is significant for a left foraminal disc osteophyte at the C6-C7 level. This is most likely the source of his pain, treating clinic with Toradol, and Depo-Medrol, and prescribed a prednisone taper. Was encouraged to follow up with his primary care provider for the management of his condition.     Barnet Glasgow, NP 06/12/16 1925

## 2016-06-12 NOTE — Discharge Instructions (Signed)
You have cervical radiculopathy, follow up with your primary care provider to receive the results of your MRI and for further management of your pain. You've received an injection of Toradol, and Depo-Medrol in clinic today. Starting tomorrow take 6 tablets of prednisone, then decrease by one each day until finished. Follow-up with primary care for further management of your pain.

## 2016-06-15 ENCOUNTER — Encounter: Payer: Self-pay | Admitting: Internal Medicine

## 2016-06-15 ENCOUNTER — Ambulatory Visit (INDEPENDENT_AMBULATORY_CARE_PROVIDER_SITE_OTHER): Payer: 59 | Admitting: Internal Medicine

## 2016-06-15 VITALS — BP 138/90 | HR 61 | Ht 73.0 in | Wt 204.0 lb

## 2016-06-15 DIAGNOSIS — G8929 Other chronic pain: Secondary | ICD-10-CM

## 2016-06-15 DIAGNOSIS — M519 Unspecified thoracic, thoracolumbar and lumbosacral intervertebral disc disorder: Secondary | ICD-10-CM | POA: Diagnosis not present

## 2016-06-15 DIAGNOSIS — I10 Essential (primary) hypertension: Secondary | ICD-10-CM

## 2016-06-15 DIAGNOSIS — M5412 Radiculopathy, cervical region: Secondary | ICD-10-CM

## 2016-06-15 DIAGNOSIS — M545 Low back pain: Secondary | ICD-10-CM

## 2016-06-15 MED ORDER — CYCLOBENZAPRINE HCL 5 MG PO TABS: 5.0000 mg | ORAL_TABLET | Freq: Three times a day (TID) | ORAL | 1 refills | Status: DC | PRN

## 2016-06-15 MED ORDER — OXYCODONE HCL 10 MG PO TABS: ORAL_TABLET | ORAL | 0 refills | Status: DC

## 2016-06-15 NOTE — Assessment & Plan Note (Signed)
Also with chronic pain - for pain clinic referral pt wife request

## 2016-06-15 NOTE — Assessment & Plan Note (Signed)
stable overall by history and exam, recent data reviewed with pt, and pt to continue medical treatment as before,  to f/u any worsening symptoms or concerns BP Readings from Last 3 Encounters:  06/15/16 138/90  06/12/16 (!) 144/95  05/31/16 (!) 142/90

## 2016-06-15 NOTE — Progress Notes (Signed)
Subjective:    Patient ID: Chris Hunt, male    DOB: [birth year], 51 y.o.   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  Pt unfortuanately has not well controlled pain despite toradol and depomedrol at [facility] apr 15, then predpac.  Good med compliance. Finances tight and is forcing himself to cont working.  Pain now about 8-9/10 with cont'd association with LUE radiation with numbness and possibly even more slightly worsened weakness as well. No worsening LE symptoms, gait issue or falls or trauma.  Also has chronic pain to lower back,, wife is asking for pain clinic referral as well. s/p lumbar surgury x 2.   Past Medical History:  Diagnosis Date  . Allergy    Allergic rhinitis  . GERD (gastroesophageal reflux disease)   . Tobacco abuse    Past Surgical History:  Procedure Laterality Date  . BACK SURGERY    . ESOPHAGUS SURGERY  84   " holes in esophagus"  . LUMBAR LAMINECTOMY/DECOMPRESSION MICRODISCECTOMY N/A 05/04/2012   Procedure: LUMBAR LAMINECTOMY/DECOMPRESSION MICRODISCECTOMY 2 LEVELS;  Surgeon: Floyce Stakes, MD;  Location: [facility];  Service: Neurosurgery;  Laterality: N/A;  Bilateral Lumbar Five-Sacral One Diskectomy/Left Lumbar Four-Five Foraminotomy     reports that he has been smoking Cigarettes.  He has been smoking about 1.00 pack per day. He has never used smokeless tobacco. He reports that he drinks about 7.2 oz of alcohol per week . He reports that he does not use drugs. family history includes Cancer in his father; Diabetes in his mother; Heart failure in his maternal grandmother; Hypertension in his mother. Allergies  Allergen Reactions  . Shellfish Allergy Anaphylaxis  . Lactose Intolerance (Gi) Diarrhea   Current Outpatient Prescriptions on File Prior to Visit  Medication Sig Dispense Refill  .  cetirizine (ZYRTEC) 10 MG tablet Take 1 tablet (10 mg total) by mouth daily. 30 tablet 11  . diclofenac sodium (VOLTAREN) 1 % GEL Apply 4 g topically 4 (four) times daily as needed. 6 g 11  . gabapentin (NEURONTIN) 300 MG capsule Take 1 capsule (300 mg total) by mouth 3 (three) times daily. To start after the initial week of 100 mg three times per day for 1 wk 90 capsule 3  . ibuprofen (ADVIL,MOTRIN) 800 MG tablet Take 1 tablet (800 mg total) by mouth 3 (three) times daily. 21 tablet 0  . metoprolol (LOPRESSOR) 50 MG tablet Take 1 tablet (50 mg total) by mouth 2 (two) times daily. 180 tablet 3  . pantoprazole (PROTONIX) 40 MG tablet Take 1 tablet (40 mg total) by mouth daily. 90 tablet 3  . traMADol (ULTRAM) 50 MG tablet Take 1 tablet (50 mg total) by mouth every 8 (eight) hours as needed. 60 tablet 1   No current facility-administered medications on file prior to visit.    Review of Systems  Constitutional: Negative for other unusual diaphoresis or sweats HENT: Negative for ear discharge or swelling Eyes: Negative for other worsening visual disturbances Respiratory: Negative for stridor or other swelling  Gastrointestinal: Negative for worsening distension or other blood Genitourinary: Negative for retention or other urinary change Musculoskeletal: Negative for other MSK pain or swelling Skin: Negative for color change or other new lesions Neurological: Negative for worsening tremors and other numbness  Psychiatric/Behavioral: Negative for worsening agitation or other fatigue  All other system neg per pt    Objective:   Physical Exam BP 138/90   Pulse 61   Ht 6\' 1"  (1.854 m)   Wt 204 lb (92.5 kg)   SpO2 99%   BMI 26.91 kg/m  VS noted,  Constitutional: Pt appears in NAD HENT: Head: NCAT.  Right Ear: External ear normal.  Left Ear: External ear normal.  Eyes: . Pupils are equal, round, and reactive to light. Conjunctivae and EOM are normal Nose: without d/c or deformity Neck:  Neck supple. Gross normal ROM except marked reduction with left horizontal movement limited by pain, and markedly tender left lower paracervical. Cardiovascular: Normal rate and regular rhythm.   Pulmonary/Chest: Effort normal and breath sounds without rales or wheezing.  Abd:  Soft, NT, ND, + BS, no organomegaly Spine:  Tender midline low cervical as well as lesser but significant tender to high lumbar Neurological: Pt is alert. At baseline orientation, motor grossly intact except 4-4+/5 LUE motor and decreased sens to LT Skin: Skin is warm. No rashes, other new lesions, no LE edema Psychiatric: Pt behavior is normal without agitation  No other new exam findings  06/11/2016 MRI CERVICAL SPINE WITHOUT CONTRAST - summary IMPRESSION: 1. C6-C7 is the likely symptomatic level, where there is a left foraminal disc osteophyte complex causing severe left neural foraminal stenosis. 2. No other foraminal or spinal canal stenosis.    Assessment & Plan:

## 2016-06-15 NOTE — Progress Notes (Signed)
Pre visit review using our clinic review tool, if applicable. No additional management support is needed unless otherwise documented below in the visit note. 

## 2016-06-15 NOTE — Assessment & Plan Note (Signed)
With persistent possibly worsening pain and neuro changes, for NS f/u as referred, pain control with oxycodone 10's prn,  to f/u any worsening symptoms or concerns

## 2016-06-15 NOTE — Patient Instructions (Signed)
Please take all new medication as prescribed - the pain medication, and the muscle relaxer  Please finish the prednisone as you are doing  Please continue all other medications as before, and refills have been done if requested.  Please have the pharmacy call with any other refills you may need.  Please keep your appointments with your specialists as you may have planned  You have been referred to neurosurgury on Apr 3, so you should be hearing soon  You will be contacted regarding the referral for: pain management

## 2016-06-30 ENCOUNTER — Telehealth: Payer: Self-pay | Admitting: Internal Medicine

## 2016-06-30 MED ORDER — OXYCODONE HCL 10 MG PO TABS: ORAL_TABLET | ORAL | 0 refills | Status: DC

## 2016-06-30 NOTE — Telephone Encounter (Signed)
Pt came by the office wanting to know if he could have a refill on his Oxycodone HCl prescription. He has been tolerating the 10mg  well and wanted to know if it could be increased to 15mg . Please advise.

## 2016-06-30 NOTE — Telephone Encounter (Signed)
He has been made aware and expressed understanding. He mentioned that he is transferring care to pursue other options for his pain.

## 2016-06-30 NOTE — Telephone Encounter (Signed)
[facility] for refill, but I cannot increase the dose due to risk of addiction and side effects  Please be aware that I will not prescribe any medication like oxycodone after September 14, 2016 for you since this is 3 mo from the start of oxycodone;  This includes whether or not you are seen per surgury.  This is b/c I do not treat chronic pain with narcotics at sched II or higher  I will understand if pt wants transfer care to another provider

## 2016-08-15 ENCOUNTER — Telehealth: Payer: Self-pay | Admitting: *Deleted

## 2016-08-15 NOTE — Telephone Encounter (Signed)
Rec'd call pt states he is needing a refill on his Oxycodone. He has appt schedule to see pain clinic but its not until 08/22/16. Inform pt MD is out of office today will be tomorrow before msg will be address...Chris Hunt

## 2016-08-16 MED ORDER — OXYCODONE HCL 10 MG PO TABS: ORAL_TABLET | ORAL | 0 refills | Status: DC

## 2016-08-16 NOTE — Telephone Encounter (Signed)
Done hardcopy to Shirron  

## 2016-08-16 NOTE — Telephone Encounter (Signed)
LVM informing pt Script at front desk 

## 2016-12-16 ENCOUNTER — Telehealth: Payer: Self-pay | Admitting: Internal Medicine

## 2016-12-16 DIAGNOSIS — G8929 Other chronic pain: Secondary | ICD-10-CM

## 2016-12-16 NOTE — Telephone Encounter (Signed)
I  Believe he is referring to his Pain management referral  I will forward to PCC's to be aware

## 2016-12-16 NOTE — Telephone Encounter (Signed)
Pt called and states before he can see Dr. Mirna Mires again he needs a referral, he would like to see Dr. Mirna Mires again because of pain after his surgery, please advise

## 2016-12-16 NOTE — Telephone Encounter (Signed)
Ok, this is done 

## 2016-12-19 NOTE — Telephone Encounter (Signed)
Referral sent to [facility]

## 2016-12-19 NOTE — Telephone Encounter (Signed)
Called pt, LVM with detail msg of denial.

## 2016-12-19 NOTE — Telephone Encounter (Signed)
Pt called and would like a Rx for his pain he would like a refill of his OXYCODONE, Pt was advise of JJ absence, please advise with another Md, states he needs the pain medicine to continue working.  Please call back

## 2016-12-19 NOTE — Telephone Encounter (Signed)
This cannot be done. Dr. Jenny Reichmann informed patient he would not be able to get refill of oxycodone after 08/16/16 so will not fill as this is not a long term medication.

## 2016-12-21 NOTE — Telephone Encounter (Signed)
Please resend to [facility]  Fax Number [phone] States they did not receive it

## 2016-12-21 NOTE — Telephone Encounter (Signed)
Referral resent to [facility]

## 2017-01-17 ENCOUNTER — Encounter: Payer: PRIVATE HEALTH INSURANCE | Admitting: Internal Medicine

## 2017-02-16 ENCOUNTER — Encounter: Payer: 59 | Admitting: Internal Medicine

## 2017-08-26 IMAGING — CR DG CHEST 1V PORT
2 series · 2 of 2 positions shown · non-contrast
Comparison: 03/17/2015

CLINICAL DATA: Chest pain

EXAM:
PORTABLE CHEST 1 VIEW

[AP (1 of 2)]
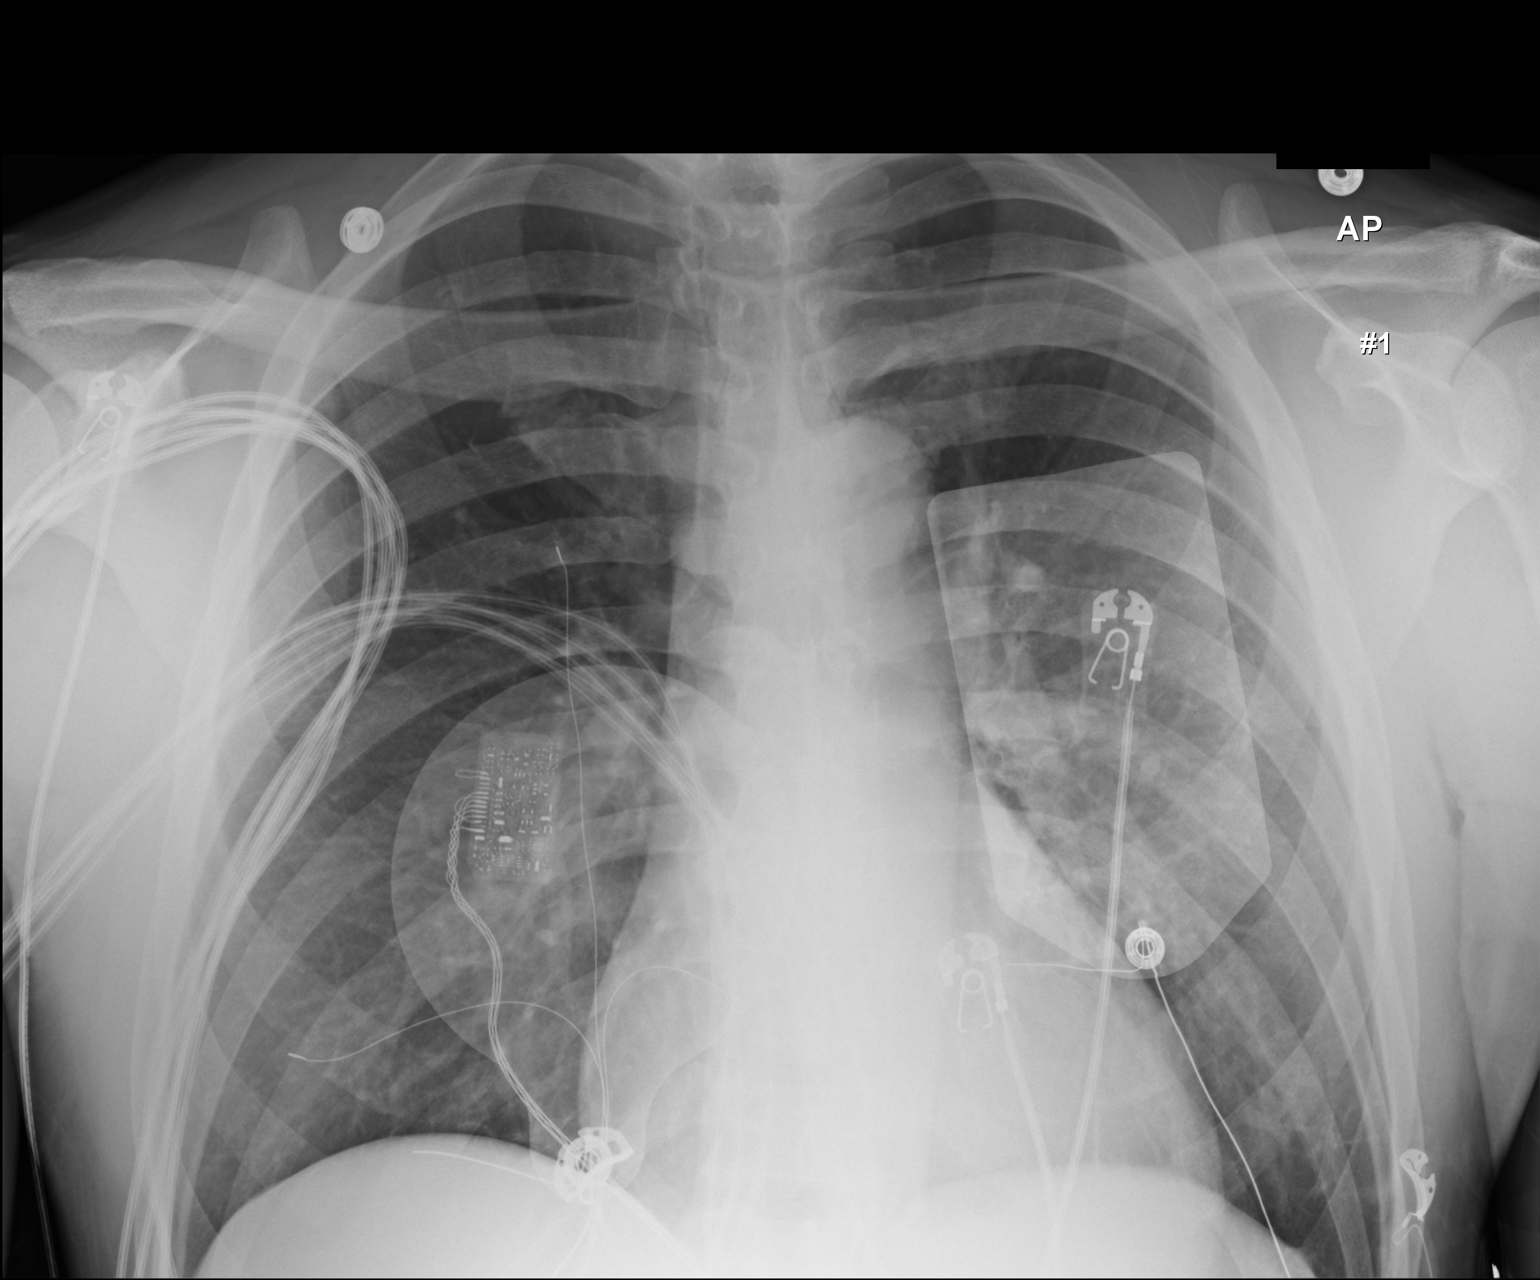

[AP (2 of 2)]
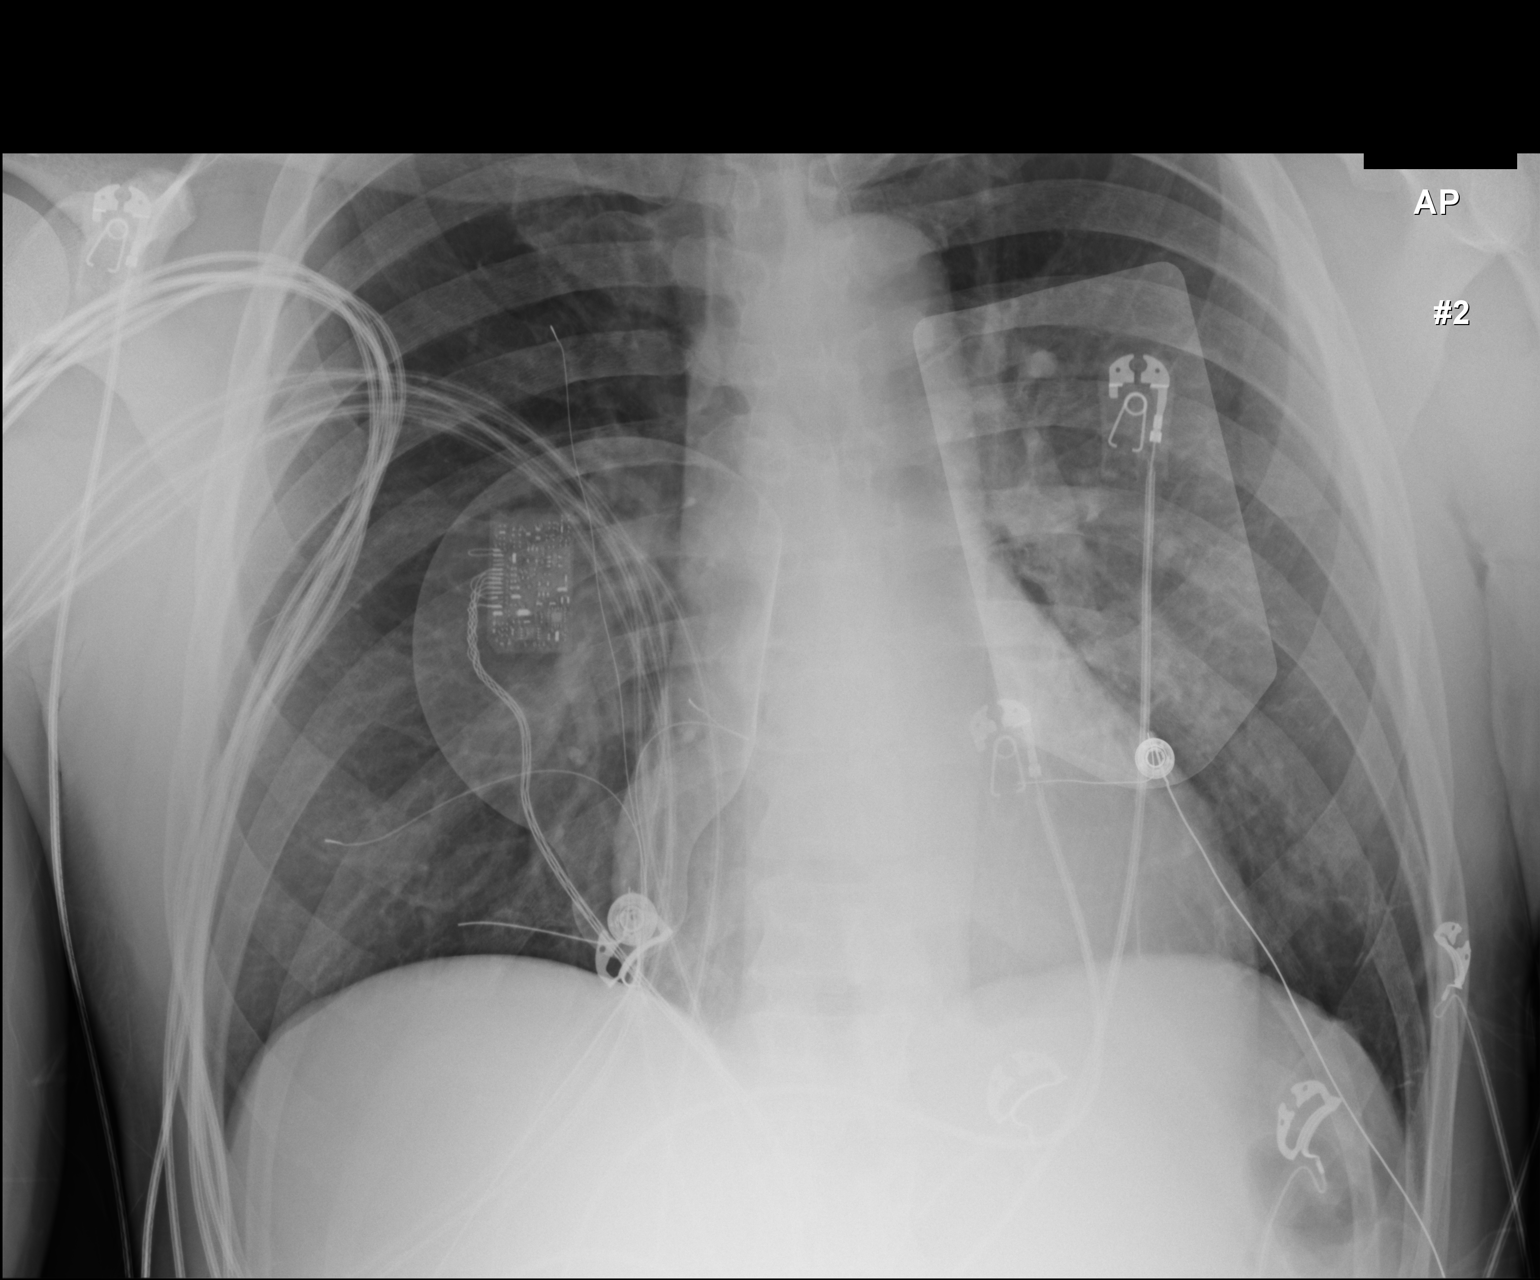

[2 of 2 positions shown; findings below may reference images not displayed]

FINDINGS: EKG leads and defibrillator pads create artifact over the chest.

Normal heart size and mediastinal contours. No acute infiltrate or
edema. No effusion or pneumothorax. No acute osseous findings.
IMPRESSION: Negative limited chest.

## 2018-07-18 ENCOUNTER — Encounter: Payer: Self-pay | Admitting: Internal Medicine

## 2018-07-18 ENCOUNTER — Other Ambulatory Visit (INDEPENDENT_AMBULATORY_CARE_PROVIDER_SITE_OTHER): Payer: 59

## 2018-07-18 ENCOUNTER — Ambulatory Visit (INDEPENDENT_AMBULATORY_CARE_PROVIDER_SITE_OTHER): Payer: 59 | Admitting: Internal Medicine

## 2018-07-18 ENCOUNTER — Other Ambulatory Visit: Payer: Self-pay | Admitting: Internal Medicine

## 2018-07-18 ENCOUNTER — Other Ambulatory Visit: Payer: Self-pay

## 2018-07-18 VITALS — BP 134/86 | HR 97 | Temp 97.8°F | Ht 73.0 in | Wt 232.0 lb

## 2018-07-18 DIAGNOSIS — IMO0001 Reserved for inherently not codable concepts without codable children: Secondary | ICD-10-CM

## 2018-07-18 DIAGNOSIS — G4710 Hypersomnia, unspecified: Secondary | ICD-10-CM | POA: Diagnosis not present

## 2018-07-18 DIAGNOSIS — Z23 Encounter for immunization: Secondary | ICD-10-CM

## 2018-07-18 DIAGNOSIS — Z114 Encounter for screening for human immunodeficiency virus [HIV]: Secondary | ICD-10-CM | POA: Diagnosis not present

## 2018-07-18 DIAGNOSIS — R739 Hyperglycemia, unspecified: Secondary | ICD-10-CM | POA: Diagnosis not present

## 2018-07-18 DIAGNOSIS — E559 Vitamin D deficiency, unspecified: Secondary | ICD-10-CM | POA: Diagnosis not present

## 2018-07-18 DIAGNOSIS — E538 Deficiency of other specified B group vitamins: Secondary | ICD-10-CM

## 2018-07-18 DIAGNOSIS — M5416 Radiculopathy, lumbar region: Secondary | ICD-10-CM

## 2018-07-18 DIAGNOSIS — F520 Hypoactive sexual desire disorder: Secondary | ICD-10-CM

## 2018-07-18 DIAGNOSIS — Z0001 Encounter for general adult medical examination with abnormal findings: Secondary | ICD-10-CM | POA: Diagnosis not present

## 2018-07-18 DIAGNOSIS — I10 Essential (primary) hypertension: Secondary | ICD-10-CM

## 2018-07-18 DIAGNOSIS — E611 Iron deficiency: Secondary | ICD-10-CM | POA: Diagnosis not present

## 2018-07-18 DIAGNOSIS — M5412 Radiculopathy, cervical region: Secondary | ICD-10-CM | POA: Insufficient documentation

## 2018-07-18 LAB — VITAMIN B12: Vitamin B-12: 238 pg/mL (ref 211–911)

## 2018-07-18 LAB — URINALYSIS, ROUTINE W REFLEX MICROSCOPIC
Bilirubin Urine: NEGATIVE
Ketones, ur: NEGATIVE
Leukocytes,Ua: NEGATIVE
Nitrite: NEGATIVE
Specific Gravity, Urine: 1.02 (ref 1.000–1.030)
Total Protein, Urine: NEGATIVE
Urine Glucose: NEGATIVE
Urobilinogen, UA: 0.2 (ref 0.0–1.0)
pH: 6 (ref 5.0–8.0)

## 2018-07-18 LAB — CBC WITH DIFFERENTIAL/PLATELET
Basophils Absolute: 0 10*3/uL (ref 0.0–0.1)
Basophils Relative: 0.6 % (ref 0.0–3.0)
Eosinophils Absolute: 0.1 10*3/uL (ref 0.0–0.7)
Eosinophils Relative: 1.5 % (ref 0.0–5.0)
HCT: 43.3 % (ref 39.0–52.0)
Hemoglobin: 14.9 g/dL (ref 13.0–17.0)
Lymphocytes Relative: 26.7 % (ref 12.0–46.0)
Lymphs Abs: 2.1 10*3/uL (ref 0.7–4.0)
MCHC: 34.5 g/dL (ref 30.0–36.0)
MCV: 83.8 fl (ref 78.0–100.0)
Monocytes Absolute: 0.5 10*3/uL (ref 0.1–1.0)
Monocytes Relative: 6.2 % (ref 3.0–12.0)
Neutro Abs: 5 10*3/uL (ref 1.4–7.7)
Neutrophils Relative %: 65 % (ref 43.0–77.0)
Platelets: 183 10*3/uL (ref 150.0–400.0)
RBC: 5.17 Mil/uL (ref 4.22–5.81)
RDW: 15.6 % — ABNORMAL HIGH (ref 11.5–15.5)
WBC: 7.7 10*3/uL (ref 4.0–10.5)

## 2018-07-18 LAB — VITAMIN D 25 HYDROXY (VIT D DEFICIENCY, FRACTURES): VITD: 16.16 ng/mL — ABNORMAL LOW (ref 30.00–100.00)

## 2018-07-18 LAB — HEPATIC FUNCTION PANEL
ALT: 23 U/L (ref 0–53)
AST: 18 U/L (ref 0–37)
Albumin: 4.4 g/dL (ref 3.5–5.2)
Alkaline Phosphatase: 86 U/L (ref 39–117)
Bilirubin, Direct: 0.1 mg/dL (ref 0.0–0.3)
Total Bilirubin: 0.5 mg/dL (ref 0.2–1.2)
Total Protein: 7.2 g/dL (ref 6.0–8.3)

## 2018-07-18 LAB — TSH: TSH: 0.62 u[IU]/mL (ref 0.35–4.50)

## 2018-07-18 LAB — BASIC METABOLIC PANEL
BUN: 13 mg/dL (ref 6–23)
CO2: 24 mEq/L (ref 19–32)
Calcium: 9.3 mg/dL (ref 8.4–10.5)
Chloride: 102 mEq/L (ref 96–112)
Creatinine, Ser: 1.07 mg/dL (ref 0.40–1.50)
GFR: 87.8 mL/min (ref >60.00)
Glucose, Bld: 101 mg/dL — ABNORMAL HIGH (ref 70–99)
Potassium: 3.4 mEq/L — ABNORMAL LOW (ref 3.5–5.1)
Sodium: 136 mEq/L (ref 135–145)

## 2018-07-18 LAB — LIPID PANEL
Cholesterol: 180 mg/dL (ref 0–200)
HDL: 64.7 mg/dL (ref >39.00)
LDL Cholesterol: 80 mg/dL (ref 0–99)
NonHDL: 115.15
Total CHOL/HDL Ratio: 3
Triglycerides: 175 mg/dL — ABNORMAL HIGH (ref 0.0–149.0)
VLDL: 35 mg/dL (ref 0.0–40.0)

## 2018-07-18 LAB — TESTOSTERONE: Testosterone: 189.34 ng/dL — ABNORMAL LOW (ref 300.00–890.00)

## 2018-07-18 LAB — IBC PANEL
Iron: 64 ug/dL (ref 42–165)
Saturation Ratios: 17.9 % — ABNORMAL LOW (ref 20.0–50.0)
Transferrin: 256 mg/dL (ref 212.0–360.0)

## 2018-07-18 LAB — PSA: PSA: 1.51 ng/mL (ref 0.10–4.00)

## 2018-07-18 LAB — HEMOGLOBIN A1C: Hgb A1c MFr Bld: 6.3 % (ref 4.6–6.5)

## 2018-07-18 MED ORDER — CETIRIZINE HCL 10 MG PO TABS: 10.0000 mg | ORAL_TABLET | Freq: Every day | ORAL | 3 refills | Status: DC

## 2018-07-18 MED ORDER — SILDENAFIL CITRATE 100 MG PO TABS: 50.0000 mg | ORAL_TABLET | Freq: Every day | ORAL | 11 refills | Status: DC | PRN

## 2018-07-18 MED ORDER — TESTOSTERONE 50 MG/5GM (1%) TD GEL: 5.0000 g | Freq: Every day | TRANSDERMAL | 1 refills | Status: DC

## 2018-07-18 MED ORDER — METOPROLOL TARTRATE 50 MG PO TABS: 50.0000 mg | ORAL_TABLET | Freq: Two times a day (BID) | ORAL | 3 refills | Status: DC

## 2018-07-18 MED ORDER — GABAPENTIN 100 MG PO CAPS: 100.0000 mg | ORAL_CAPSULE | Freq: Three times a day (TID) | ORAL | 5 refills | Status: DC

## 2018-07-18 MED ORDER — PANTOPRAZOLE SODIUM 40 MG PO TBEC: 40.0000 mg | DELAYED_RELEASE_TABLET | Freq: Every day | ORAL | 3 refills | Status: DC

## 2018-07-18 MED ORDER — VITAMIN D (ERGOCALCIFEROL) 1.25 MG (50000 UNIT) PO CAPS: 50000.0000 [IU] | ORAL_CAPSULE | ORAL | 0 refills | Status: DC

## 2018-07-18 NOTE — Patient Instructions (Signed)
You had the Tdap tetanus shot today  Please take all new medication as prescribed - the viagra as needed, as well as gabapentin for nerve pain  Please continue all other medications as before, and refills have been done if requested.  Please have the pharmacy call with any other refills you may need.  Please continue your efforts at being more active, low cholesterol diet, and weight control.  You are otherwise up to date with prevention measures today.  Please keep your appointments with your specialists as you may have planned  You will be contacted regarding the referral for: pulmonary for possible sleep apnea, and colonoscopy  Please call if you change your mind about the MRI for the lower back, and Neurosurgury referral  Please go to the LAB in the Basement (turn left off the elevator) for the tests to be done today  You will be contacted by phone if any changes need to be made immediately.  Otherwise, you will receive a letter about your results with an explanation, but please check with MyChart first.  Please remember to sign up for MyChart if you have not done so, as this will be important to you in the future with finding out test results, communicating by private email, and scheduling acute appointments online when needed.  Please return in 6 months, or sooner if needed

## 2018-07-18 NOTE — Progress Notes (Signed)
Subjective:    Patient ID: Chris Hunt, male    DOB: 24-Apr-1965, 53 y.o.   MRN: [id number]  HPI  Here for wellness and f/u;  Overall doing ok;  Pt denies Chest pain, worsening SOB, DOE, wheezing, orthopnea, PND, worsening LE edema, palpitations, dizziness or syncope.  Pt denies neurological change such as new headache, facial or extremity weakness.  Pt denies polydipsia, polyuria, or low sugar symptoms. Pt states overall good compliance with treatment and medications, good tolerability, and has been trying to follow appropriate diet.  Pt denies worsening depressive symptoms, suicidal ideation or panic. No fever, night sweats, wt loss, loss of appetite, or other constitutional symptoms.  Pt states good ability with ADL's, has low fall risk, home safety reviewed and adequate, no other significant changes in hearing or vision, and only occasionally active with exercise. Also has gained significant wt after last seen with excess calories. Now unfortunately with worsening disturbed sleep much of which is due to left neck and right hip pain, but also hypersomnolence most days, without prior evaluation for OSA. Wt Readings from Last 3 Encounters:  07/18/18 232 lb (105.2 kg)  06/15/16 204 lb (92.5 kg)  05/31/16 202 lb (91.6 kg)   BP Readings from Last 3 Encounters:  07/18/18 134/86  06/15/16 138/90  06/12/16 (!) 144/95  s/p cervical spine surgury since last visit with resolved LUE pain, numb, weakness.   Also c/o increased lethargy and reduced sexual desire, has several friends with low T on replacement, believes his stamina is low, and some mild erectile  Also as 3-4 mo worsening right low back pain with radiation to the distal RLE with numbness and weakness as well, mod to severe, cant sleep on his right side, constant now. Past Medical History:  Diagnosis Date  . Allergy    Allergic rhinitis  . GERD (gastroesophageal reflux disease)   . Tobacco abuse    Past Surgical History:  Procedure  Laterality Date  . BACK SURGERY    . ESOPHAGUS SURGERY  84   " holes in esophagus"  . LUMBAR LAMINECTOMY/DECOMPRESSION MICRODISCECTOMY N/A 05/04/2012   Procedure: LUMBAR LAMINECTOMY/DECOMPRESSION MICRODISCECTOMY 2 LEVELS;  Surgeon: Floyce Stakes, MD;  Location: [facility];  Service: Neurosurgery;  Laterality: N/A;  Bilateral Lumbar Five-Sacral One Diskectomy/Left Lumbar Four-Five Foraminotomy     reports that he has been smoking cigarettes. He has been smoking about 1.00 pack per day. He has never used smokeless tobacco. He reports current alcohol use of about 12.0 standard drinks of alcohol per week. He reports that he does not use drugs. family history includes Cancer in his father; Diabetes in his mother; Heart failure in his maternal grandmother; Hypertension in his mother. Allergies  Allergen Reactions  . Shellfish Allergy Anaphylaxis  . Lactose Intolerance (Gi) Diarrhea   Current Outpatient Medications on File Prior to Visit  Medication Sig Dispense Refill  . cyclobenzaprine (FLEXERIL) 5 MG tablet Take 1 tablet (5 mg total) by mouth 3 (three) times daily as needed for muscle spasms. 40 tablet 1  . diclofenac sodium (VOLTAREN) 1 % GEL Apply 4 g topically 4 (four) times daily as needed. 6 g 11  . gabapentin (NEURONTIN) 300 MG capsule Take 1 capsule (300 mg total) by mouth 3 (three) times daily. To start after the initial week of 100 mg three times per day for 1 wk 90 capsule 3  . ibuprofen (ADVIL,MOTRIN) 800 MG tablet Take 1 tablet (800 mg total) by mouth 3 (three) times daily.  21 tablet 0  . Oxycodone HCl 10 MG TABS 1 by mouth every 6 hrs as needed for mod to severe pain 120 tablet 0  . traMADol (ULTRAM) 50 MG tablet Take 1 tablet (50 mg total) by mouth every 8 (eight) hours as needed. 60 tablet 1   No current facility-administered medications on file prior to visit.    Review of Systems Constitutional: Negative for other unusual diaphoresis, sweats, appetite or weight changes  HENT: Negative for other worsening hearing loss, ear pain, facial swelling, mouth sores or neck stiffness.   Eyes: Negative for other worsening pain, redness or other visual disturbance.  Respiratory: Negative for other stridor or swelling Cardiovascular: Negative for other palpitations or other chest pain  Gastrointestinal: Negative for worsening diarrhea or loose stools, blood in stool, distention or other pain Genitourinary: Negative for hematuria, flank pain or other change in urine volume.  Musculoskeletal: Negative for myalgias or other joint swelling.  Skin: Negative for other color change, or other wound or worsening drainage.  Neurological: Negative for other syncope or numbness. Hematological: Negative for other adenopathy or swelling Psychiatric/Behavioral: Negative for hallucinations, other worsening agitation, SI, self-injury, or new decreased concentration All other system neg per pt    Objective:   Physical Exam BP 134/86   Pulse 97   Temp 97.8 F (36.6 C) (Oral)   Ht 6\' 1"  (1.854 m)   Wt 232 lb (105.2 kg)   SpO2 96%   BMI 30.61 kg/m  VS noted,  Constitutional: Pt is oriented to person, place, and time. Appears well-developed and well-nourished, in no significant distress and comfortable Head: Normocephalic and atraumatic  Eyes: Conjunctivae and EOM are normal. Pupils are equal, round, and reactive to light Right Ear: External ear normal without discharge Left Ear: External ear normal without discharge Nose: Nose without discharge or deformity Mouth/Throat: Oropharynx is without other ulcerations and moist  Neck: Normal range of motion. Neck supple. No JVD present. No tracheal deviation present or significant neck LA or mass Cardiovascular: Normal rate, regular rhythm, normal heart sounds and intact distal pulses.   Pulmonary/Chest: WOB normal and breath sounds without rales or wheezing  Abdominal: Soft. Bowel sounds are normal. NT. No HSM  Musculoskeletal: Normal  range of motion. Exhibits no edema Lymphadenopathy: Has no other cervical adenopathy.  Neurological: Pt is alert and oriented to person, place, and time. Pt has normal reflexes. No cranial nerve deficit. Motor grossly intact except for RLE 4+/5 weakness Skin: Skin is warm and dry. No rash noted or new ulcerations Psychiatric:  Has normal mood and affect. Behavior is normal without agitation No other exam findings  Lab Results  Component Value Date   WBC 7.5 01/28/2016   HGB 15.8 01/28/2016   HCT 46.7 01/28/2016   PLT 187 01/28/2016   GLUCOSE 113 (H) 01/28/2016   CHOL 180 01/13/2016   TRIG 72.0 01/13/2016   HDL 75.00 01/13/2016   LDLCALC 91 01/13/2016   ALT 12 01/13/2016   AST 15 01/13/2016   NA 136 01/28/2016   K 4.7 01/28/2016   CL 104 01/28/2016   CREATININE 1.13 01/28/2016   BUN 11 01/28/2016   CO2 25 01/28/2016   TSH 0.57 01/13/2016   PSA 0.67 01/13/2016   HGBA1C 5.9 01/13/2016      Assessment & Plan:

## 2018-07-19 ENCOUNTER — Telehealth: Payer: Self-pay

## 2018-07-19 LAB — HIV ANTIBODY (ROUTINE TESTING W REFLEX): HIV 1&2 Ab, 4th Generation: NONREACTIVE

## 2018-07-19 NOTE — Telephone Encounter (Signed)
Called pt, LVM.   CRM created.  

## 2018-07-19 NOTE — Telephone Encounter (Signed)
-----   Message from Biagio Borg, MD sent at 07/18/2018  4:51 PM EDT ----- Left message on MyChart, pt to cont same tx except  The test results show that your current treatment is OK, except the Vitamin D level is low, as well as the testosterone level is low.  We need to: 1) take Vitamin D 50000 units weekly for 12 weeks, then take OTC Vitamin D3 2000 units per day after that indefinitely 2) take Testim as directed, and we can check further lab work at your next visit in 6 months  Shirron to please inform pt, I will do rx x 2

## 2018-07-20 ENCOUNTER — Telehealth: Payer: Self-pay

## 2018-07-20 NOTE — Telephone Encounter (Signed)
Called pt, LVM informing him that form was completed and mailed out to him.

## 2018-07-21 ENCOUNTER — Encounter: Payer: Self-pay | Admitting: Internal Medicine

## 2018-07-21 DIAGNOSIS — IMO0001 Reserved for inherently not codable concepts without codable children: Secondary | ICD-10-CM | POA: Insufficient documentation

## 2018-07-21 DIAGNOSIS — G4710 Hypersomnia, unspecified: Secondary | ICD-10-CM | POA: Insufficient documentation

## 2018-07-21 DIAGNOSIS — F520 Hypoactive sexual desire disorder: Secondary | ICD-10-CM | POA: Insufficient documentation

## 2018-07-21 DIAGNOSIS — M5416 Radiculopathy, lumbar region: Secondary | ICD-10-CM | POA: Insufficient documentation

## 2018-07-21 DIAGNOSIS — E559 Vitamin D deficiency, unspecified: Secondary | ICD-10-CM | POA: Insufficient documentation

## 2018-07-21 NOTE — Assessment & Plan Note (Signed)
With wt gain, for pulm referral

## 2018-07-21 NOTE — Assessment & Plan Note (Signed)
stable overall by history and exam, recent data reviewed with pt, and pt to continue medical treatment as before,  to f/u any worsening symptoms or concerns  

## 2018-07-21 NOTE — Assessment & Plan Note (Signed)
For lab f/u 

## 2018-07-21 NOTE — Assessment & Plan Note (Signed)
[place] for testosterone check,  to f/u any worsening symptoms or concerns

## 2018-07-21 NOTE — Assessment & Plan Note (Signed)

## 2018-07-21 NOTE — Assessment & Plan Note (Addendum)
Mild to mod, for gabapentin asd, declines MRI and NS eval for now,  to f/u any worsening symptoms or concerns  In addition to the time spent performing CPE, I spent an additional 40 minutes face to face,in which greater than 50% of this time was spent in counseling and coordination of care for patient's acute illness as documented, including the differential dx, treatment, further evaluation and other management of right lumbar radiculopathy, HTN, hyperglycemia, hypersomnolence, low sexual desire, vit d deficiency

## 2018-07-21 NOTE — Assessment & Plan Note (Signed)
stable overall by history and exam, recent data reviewed with pt, and pt to continue medical treatment as before,  to f/u any worsening symptoms or concerns, for a1c with labs 

## 2018-08-24 ENCOUNTER — Encounter: Payer: Self-pay | Admitting: Gastroenterology

## 2018-09-04 ENCOUNTER — Other Ambulatory Visit: Payer: Self-pay

## 2018-09-04 NOTE — Progress Notes (Signed)
LMOM for patient to call to r/s his PV appt.  Unable to reach after multiple attempts.

## 2018-09-10 ENCOUNTER — Encounter: Payer: 59 | Admitting: Gastroenterology

## 2018-12-31 ENCOUNTER — Encounter: Payer: Self-pay | Admitting: Internal Medicine

## 2019-01-14 ENCOUNTER — Other Ambulatory Visit: Payer: Self-pay

## 2019-01-14 ENCOUNTER — Ambulatory Visit (AMBULATORY_SURGERY_CENTER): Payer: 59 | Admitting: *Deleted

## 2019-01-14 VITALS — Temp 97.8°F | Ht 73.0 in | Wt 236.8 lb

## 2019-01-14 DIAGNOSIS — Z1159 Encounter for screening for other viral diseases: Secondary | ICD-10-CM

## 2019-01-14 DIAGNOSIS — Z1211 Encounter for screening for malignant neoplasm of colon: Secondary | ICD-10-CM

## 2019-01-14 NOTE — Progress Notes (Signed)
No egg or soy allergy known to patient  No issues with past sedation with any surgeries  or procedures, no intubation problems  No diet pills per patient No home 02 use per patient  No blood thinners per patient  Pt denies issues with constipation  No A fib or A flutter  EMMI video sent to pt's e mail   Due to the COVID-19 pandemic we are asking patients to follow these guidelines. Please only bring one care partner. Please be aware that your care partner may wait in the car in the parking lot or if they feel like they will be too hot to wait in the car, they may wait in the lobby on the 4th floor. All care partners are required to wear a mask the entire time (we do not have any that we can provide them), they need to practice social distancing, and we will do a Covid check for all patient's and care partners when you arrive. Also we will check their temperature and your temperature. If the care partner waits in their car they need to stay in the parking lot the entire time and we will call them on their cell phone when the patient is ready for discharge so they can bring the car to the front of the building. Also all patient's will need to wear a mask into building.  covid 01/23/19, 2:35 pm  miralax sheet otc provided

## 2019-01-15 ENCOUNTER — Encounter: Payer: Self-pay | Admitting: Internal Medicine

## 2019-01-21 ENCOUNTER — Ambulatory Visit: Payer: 59 | Admitting: Internal Medicine

## 2019-01-23 ENCOUNTER — Other Ambulatory Visit: Payer: Self-pay | Admitting: Internal Medicine

## 2019-01-25 LAB — SARS CORONAVIRUS 2 (TAT 6-24 HRS): SARS Coronavirus 2: NEGATIVE

## 2019-01-28 ENCOUNTER — Other Ambulatory Visit: Payer: Self-pay

## 2019-01-28 ENCOUNTER — Ambulatory Visit (AMBULATORY_SURGERY_CENTER): Payer: 59 | Admitting: Internal Medicine

## 2019-01-28 ENCOUNTER — Encounter: Payer: Self-pay | Admitting: Internal Medicine

## 2019-01-28 VITALS — BP 125/87 | HR 61 | Temp 98.4°F | Resp 16 | Ht 73.0 in | Wt 236.8 lb

## 2019-01-28 DIAGNOSIS — Z1211 Encounter for screening for malignant neoplasm of colon: Secondary | ICD-10-CM | POA: Diagnosis not present

## 2019-01-28 DIAGNOSIS — K635 Polyp of colon: Secondary | ICD-10-CM

## 2019-01-28 DIAGNOSIS — D124 Benign neoplasm of descending colon: Secondary | ICD-10-CM

## 2019-01-28 MED ORDER — SODIUM CHLORIDE 0.9 % IV SOLN: 500.0000 mL | Freq: Once | INTRAVENOUS | Status: DC

## 2019-01-28 NOTE — Progress Notes (Signed)
A/ox3, pleased with MAC, report to RN 

## 2019-01-28 NOTE — Patient Instructions (Addendum)
I found and removed one tiny colon polyp.  You also have diverticulosis - thickened muscle rings and pouches in the colon wall. Please read the handout about this condition.  I will let you know pathology results and when to have another routine colonoscopy by mail and/or My Chart.  I appreciate the opportunity to care for you. Gatha Mayer, MD, FACG YOU HAD AN ENDOSCOPIC PROCEDURE TODAY AT [facility]:   Refer to the procedure report that was given to you for any specific questions about what was found during the examination.  If the procedure report does not answer your questions, please call your gastroenterologist to clarify.  If you requested that your care partner not be given the details of your procedure findings, then the procedure report has been included in a sealed envelope for you to review at your convenience later.  YOU SHOULD EXPECT: Some feelings of bloating in the abdomen. Passage of more gas than usual.  Walking can help get rid of the air that was put into your GI tract during the procedure and reduce the bloating. If you had a lower endoscopy (such as a colonoscopy or flexible sigmoidoscopy) you may notice spotting of blood in your stool or on the toilet paper. If you underwent a bowel prep for your procedure, you may not have a normal bowel movement for a few days.  Please Note:  You might notice some irritation and congestion in your nose or some drainage.  This is from the oxygen used during your procedure.  There is no need for concern and it should clear up in a day or so.  SYMPTOMS TO REPORT IMMEDIATELY:   Following lower endoscopy (colonoscopy or flexible sigmoidoscopy):  Excessive amounts of blood in the stool  Significant tenderness or worsening of abdominal pains  Swelling of the abdomen that is new, acute  Fever of 100F or higher   For urgent or emergent issues, a gastroenterologist can be reached at any hour by calling [phone]  [phone].   DIET:  We do recommend a small meal at first, but then you may proceed to your regular diet.  Drink plenty of fluids but you should avoid alcoholic beverages for 24 hours.  MEDICATIONS:  Continue present medications.  Please see handouts given to you by your recovery nurse.  ACTIVITY:  You should plan to take it easy for the rest of today and you should NOT DRIVE or use heavy machinery until tomorrow (because of the sedation medicines used during the test).    FOLLOW UP: Our staff will call the number listed on your records 48-72 hours following your procedure to check on you and address any questions or concerns that you may have regarding the information given to you following your procedure. If we do not reach you, we will leave a message.  We will attempt to reach you two times.  During this call, we will ask if you have developed any symptoms of COVID 19. If you develop any symptoms (ie: fever, flu-like symptoms, shortness of breath, cough etc.) before then, please call [phone].  If you test positive for Covid 19 in the 2 weeks post procedure, please call and report this information to [place].    If any biopsies were taken you will be contacted by phone or by letter within the next 1-3 weeks.  Please call us at [phone] if you have not heard about the biopsies in 3 weeks.   Thank you for allowing  [place] to provide for your healthcare needs today.   SIGNATURES/CONFIDENTIALITY: You and/or your care partner have signed paperwork which will be entered into your electronic medical record.  These signatures attest to the fact that that the information above on your After Visit Summary has been reviewed and is understood.  Full responsibility of the confidentiality of this discharge information lies with you and/or your care-partner.

## 2019-01-28 NOTE — Op Note (Signed)
[place] Patient Name: Chris Hunt Procedure Date: 01/28/2019 11:06 AM MRN: [id number] Endoscopist: Gatha Mayer , MD Age: 53 Referring MD:  Date of Birth: 29-Apr-1965 Gender: Male Account #: [id number] Procedure:                Colonoscopy Indications:              Screening for colorectal malignant neoplasm, This                            is the patient's first colonoscopy Medicines:                Propofol per Anesthesia, Monitored Anesthesia Care Procedure:                Pre-Anesthesia Assessment:                           - Prior to the procedure, a History and Physical                            was performed, and patient medications and                            allergies were reviewed. The patient's tolerance of                            previous anesthesia was also reviewed. The risks                            and benefits of the procedure and the sedation                            options and risks were discussed with the patient.                            All questions were answered, and informed consent                            was obtained. Prior Anticoagulants: The patient has                            taken no previous anticoagulant or antiplatelet                            agents. ASA Grade Assessment: II - A patient with                            mild systemic disease. After reviewing the risks                            and benefits, the patient was deemed in                            satisfactory condition to undergo the procedure.  After obtaining informed consent, the colonoscope                            was passed under direct vision. Throughout the                            procedure, the patient's blood pressure, pulse, and                            oxygen saturations were monitored continuously. The                            Colonoscope was introduced through the anus and                             advanced to the the cecum, identified by                            appendiceal orifice and ileocecal valve. The                            colonoscopy was performed without difficulty. The                            patient tolerated the procedure well. The quality                            of the bowel preparation was excellent. The                            ileocecal valve, appendiceal orifice, and rectum                            were photographed. The bowel preparation used was                            Miralax via split dose instruction. Scope In: 11:23:24 AM Scope Out: 11:36:57 AM Scope Withdrawal Time: 0 hours 9 minutes 2 seconds  Total Procedure Duration: 0 hours 13 minutes 33 seconds  Findings:                 The perianal and digital rectal examinations were                            normal. Pertinent negatives include normal prostate                            (size, shape, and consistency).                           A diminutive polyp was found in the descending                            colon. The polyp was flat. The polyp was removed  with a cold snare. Resection and retrieval were                            complete. Verification of patient identification                            for the specimen was done. Estimated blood loss was                            minimal.                           Multiple diverticula were found in the sigmoid                            colon and ascending colon.                           The exam was otherwise without abnormality on                            direct and retroflexion views. Complications:            No immediate complications. Estimated Blood Loss:     Estimated blood loss was minimal. Estimated blood                            loss was minimal. Impression:               - One diminutive polyp in the descending colon,                            removed with a cold snare. Resected and  retrieved.                           - Diverticulosis in the sigmoid colon and in the                            ascending colon.                           - The examination was otherwise normal on direct                            and retroflexion views. Recommendation:           - Patient has a contact number available for                            emergencies. The signs and symptoms of potential                            delayed complications were discussed with the                            patient. Return to normal activities tomorrow.  Written discharge instructions were provided to the                            patient.                           - Resume previous diet.                           - Continue present medications.                           - Repeat colonoscopy is recommended. The                            colonoscopy date will be determined after pathology                            results from today's exam become available for                            review. Gatha Mayer, MD 01/28/2019 11:44:01 AM This report has been signed electronically.

## 2019-01-28 NOTE — Progress Notes (Signed)
Called to room to assist during endoscopic procedure.  Patient ID and intended procedure confirmed with present staff. Received instructions for my participation in the procedure from the performing physician.  

## 2019-01-30 ENCOUNTER — Telehealth: Payer: Self-pay | Admitting: *Deleted

## 2019-01-30 ENCOUNTER — Telehealth: Payer: Self-pay

## 2019-01-30 NOTE — Telephone Encounter (Signed)
  Follow up Call-  Call back number 01/28/2019  Post procedure Call Back phone  # [phone]  Permission to leave phone message Yes  Some recent data might be hidden     Patient questions:  Do you have a fever, pain , or abdominal swelling? No. Pain Score  0 *  Have you tolerated food without any problems? Yes.    Have you been able to return to your normal activities? Yes.    Do you have any questions about your discharge instructions: Diet   No. Medications  No. Follow up visit  No.  Do you have questions or concerns about your Care? No.  Actions: * If pain score is 4 or above: No action needed, pain <4.  1. Have you developed a fever since your procedure? no  2.   Have you had an respiratory symptoms (SOB or cough) since your procedure? no  3.   Have you tested positive for COVID 19 since your procedure no  4.   Have you had any family members/close contacts diagnosed with the COVID 19 since your procedure?  no   If yes to any of these questions please route to Joylene John, RN and Alphonsa Gin, Therapist, sports.

## 2019-01-30 NOTE — Telephone Encounter (Signed)
Follow up call made, left message. 

## 2019-02-03 ENCOUNTER — Encounter: Payer: Self-pay | Admitting: Internal Medicine

## 2019-02-03 NOTE — Progress Notes (Signed)
Distal diminutive hyperplastic polyp Recall 2030

## 2019-03-09 ENCOUNTER — Other Ambulatory Visit: Payer: Self-pay

## 2019-03-09 ENCOUNTER — Emergency Department (HOSPITAL_COMMUNITY): Payer: 59

## 2019-03-09 ENCOUNTER — Encounter (HOSPITAL_COMMUNITY): Payer: Self-pay

## 2019-03-09 DIAGNOSIS — Z79899 Other long term (current) drug therapy: Secondary | ICD-10-CM | POA: Diagnosis not present

## 2019-03-09 DIAGNOSIS — F17210 Nicotine dependence, cigarettes, uncomplicated: Secondary | ICD-10-CM | POA: Diagnosis not present

## 2019-03-09 DIAGNOSIS — M25562 Pain in left knee: Secondary | ICD-10-CM | POA: Diagnosis not present

## 2019-03-09 DIAGNOSIS — I10 Essential (primary) hypertension: Secondary | ICD-10-CM | POA: Diagnosis not present

## 2019-03-09 DIAGNOSIS — M25561 Pain in right knee: Secondary | ICD-10-CM | POA: Insufficient documentation

## 2019-03-09 NOTE — ED Triage Notes (Signed)
Pt states he slipped on ice by his car and hit his left knee. Pt c/o bilateral knee pain and right ankle pain.

## 2019-03-10 ENCOUNTER — Emergency Department (HOSPITAL_COMMUNITY)
Admission: EM | Admit: 2019-03-10 | Discharge: 2019-03-10 | Disposition: A | Discharge status: Home / Self Care | Payer: 59 | Attending: Emergency Medicine | Admitting: Emergency Medicine

## 2019-03-10 DIAGNOSIS — W19XXXA Unspecified fall, initial encounter: Secondary | ICD-10-CM

## 2019-03-10 DIAGNOSIS — M25562 Pain in left knee: Secondary | ICD-10-CM

## 2019-03-10 DIAGNOSIS — M25561 Pain in right knee: Secondary | ICD-10-CM

## 2019-03-10 MED ORDER — OXYCODONE HCL 5 MG PO TABS
10.0000 mg | ORAL_TABLET | Freq: Once | ORAL | Status: AC
  Administered 2019-03-10: 10 mg via ORAL
  Filled 2019-03-10: qty 2

## 2019-03-10 MED ORDER — HYDROCODONE-ACETAMINOPHEN 5-325 MG PO TABS: 1.0000 | ORAL_TABLET | Freq: Four times a day (QID) | ORAL | 0 refills | Status: DC | PRN

## 2019-03-10 MED ORDER — DICLOFENAC SODIUM 1 % EX GEL: 2.0000 g | Freq: Four times a day (QID) | CUTANEOUS | 0 refills | Status: DC

## 2019-03-10 NOTE — ED Notes (Signed)
Pt provided with a sandwich, water and crackers.

## 2019-03-10 NOTE — ED Provider Notes (Signed)
[facility] DEPT Provider Note   CSN: [id number] Arrival date & time: 03/09/19  2212     History Chief Complaint  Patient presents with  . Fall  . Knee Pain  . Right Ankle Pain    Chris Hunt is a 54 y.o. male with a hx of HTN presents to the Emergency Department complaining of acute, persistent, progressively worsening bilateral knee pain onset yesterday morning after a slip and fall on the ice.  Pt reports his right medial knee stuck first, followed by his left knee.  Pt reports he was able to walk afterwards with moderate pain, but the pain has increased throughout the afternoon and night to the point that he cannot walk anymore.  No treatments PTA.  Pt denies numbness or weakness in his legs, back pain, loss of bowel or bladder, hitting his head or LOC.  Pt reports some associated right ankle pain, but he believes this is coming from the knee.  Movement, palpation and ambulation make the pain significantly worse.  No previous knee surgeries or injuries.       The history is provided by the patient and medical records. No language interpreter was used.       Past Medical History:  Diagnosis Date  . Allergy    Allergic rhinitis  . GERD (gastroesophageal reflux disease)   . Hypertension   . Tobacco abuse     Patient Active Problem List   Diagnosis Date Noted  . Right lumbar radiculopathy 07/21/2018  . Hypersomnolence 07/21/2018  . 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 Vitamin D deficiency 07/21/2018  . Cervical radiculitis 07/18/2018  . Left cervical radiculopathy 06/06/2016  . HTN (hypertension) 06/06/2016  . SVT (supraventricular tachycardia) ([place]) 02/05/2016  . Chest pain 02/05/2016  . Pain of left thumb 02/05/2016  . Acute upper respiratory infection 01/17/2016  . Encounter for well adult exam with abnormal findings 01/13/2016  . Lumbar disc disease 01/13/2016  . History of gastric bypass 01/13/2016  . Hyperglycemia  01/13/2016  . Influenza-like syndrome 03/23/2012  . Foot pain, left 08/17/2011  . Lumbosacral strain 07/26/2011  . Right shoulder pain 01/06/2011  . Allergic rhinitis 01/05/2011  . Cough, persistent 11/09/2010  . TOBACCO USER 04/07/2008  . GERD 04/07/2008    Past Surgical History:  Procedure Laterality Date  . BACK SURGERY    . ESOPHAGUS SURGERY  85   " holes in esophagus"  . LUMBAR LAMINECTOMY/DECOMPRESSION MICRODISCECTOMY N/A 05/04/2012   Procedure: LUMBAR LAMINECTOMY/DECOMPRESSION MICRODISCECTOMY 2 LEVELS;  Surgeon: Floyce Stakes, MD;  Location: [facility];  Service: Neurosurgery;  Laterality: N/A;  Bilateral Lumbar Five-Sacral One Diskectomy/Left Lumbar Four-Five Foraminotomy        Family History  Problem Relation Age of Onset  . Cancer Father   . Diabetes Mother   . Hypertension Mother   . Heart failure Maternal Grandmother   . Colon cancer Neg Hx   . Colon polyps Neg Hx   . Esophageal cancer Neg Hx   . Rectal cancer Neg Hx   . Stomach cancer Neg Hx     Social History   Tobacco Use  . Smoking status: Current Every Day Smoker    Packs/day: 1.00    Types: Cigarettes  . Smokeless tobacco: Never Used  Substance Use Topics  . Alcohol use: Yes    Alcohol/week: 12.0 standard drinks    Types: 12 Cans of beer per week    Comment: 3-4 week/liquor  . Drug use: No  Home Medications Prior to Admission medications   Medication Sig Start Date End Date Taking? Authorizing Provider  cetirizine (ZYRTEC) 10 MG tablet Take 1 tablet (10 mg total) by mouth daily. 07/18/18 07/18/19  Biagio Borg, MD  cyclobenzaprine (FLEXERIL) 5 MG tablet Take 1 tablet (5 mg total) by mouth 3 (three) times daily as needed for muscle spasms. 06/15/16   Biagio Borg, MD  diclofenac Sodium (VOLTAREN) 1 % GEL Apply 2 g topically 4 (four) times daily. 03/10/19   Savina Olshefski, Jarrett Soho, PA-C  gabapentin (NEURONTIN) 100 MG capsule Take 1 capsule (100 mg total) by mouth 3 (three) times daily. Patient  not taking: Reported on 01/14/2019 07/18/18   Biagio Borg, MD  HYDROcodone-acetaminophen (NORCO/VICODIN) 5-325 MG tablet Take 1-2 tablets by mouth every 6 (six) hours as needed for moderate pain or severe pain. 03/10/19   Candido Flott, Jarrett Soho, PA-C  ibuprofen (ADVIL,MOTRIN) 800 MG tablet Take 1 tablet (800 mg total) by mouth 3 (three) times daily. Patient not taking: Reported on 01/14/2019 03/17/15   Arlean Hopping C, PA-C  metoprolol tartrate (LOPRESSOR) 50 MG tablet Take 1 tablet (50 mg total) by mouth 2 (two) times daily. 07/18/18   Biagio Borg, MD  Oxycodone HCl 10 MG TABS 1 by mouth every 6 hrs as needed for mod to severe pain Patient not taking: Reported on 01/14/2019 08/16/16   Biagio Borg, MD  pantoprazole (PROTONIX) 40 MG tablet Take 1 tablet (40 mg total) by mouth daily. 07/18/18   Biagio Borg, MD  sildenafil (VIAGRA) 100 MG tablet Take 0.5-1 tablets (50-100 mg total) by mouth daily as needed for erectile dysfunction. Patient not taking: Reported on 01/14/2019 07/18/18   Biagio Borg, MD  testosterone (ANDROGEL) 50 MG/5GM (1%) GEL Place 5 g onto the skin daily. Patient not taking: Reported on 01/14/2019 07/18/18   Biagio Borg, MD  traMADol (ULTRAM) 50 MG tablet Take 1 tablet (50 mg total) by mouth every 8 (eight) hours as needed. Patient not taking: Reported on 01/14/2019 05/31/16   Biagio Borg, MD  Vitamin D, Ergocalciferol, (DRISDOL) 1.25 MG (50000 UT) CAPS capsule Take 1 capsule (50,000 Units total) by mouth every 7 (seven) days. 07/18/18   Biagio Borg, MD    Allergies    Shellfish allergy and Lactose intolerance (gi)  Review of Systems   Review of Systems  Constitutional: Negative for chills and fever.  Gastrointestinal: Negative for nausea and vomiting.  Musculoskeletal: Positive for arthralgias, gait problem and joint swelling. Negative for back pain, neck pain and neck stiffness.  Skin: Negative for wound.  Neurological: Negative for numbness.  Hematological: Does not  bruise/bleed easily.  Psychiatric/Behavioral: The patient is not nervous/anxious.   All other systems reviewed and are negative.   Physical Exam Updated Vital Signs BP (!) 141/102 (BP Location: Right Arm)   Pulse (!) 58   Temp 98.4 F (36.9 C) (Oral)   Resp 18   SpO2 95%   Physical Exam Vitals and nursing note reviewed.  Constitutional:      General: He is not in acute distress.    Appearance: He is well-developed.  HENT:     Head: Normocephalic.  Eyes:     General: No scleral icterus.    Conjunctiva/sclera: Conjunctivae normal.  Cardiovascular:     Rate and Rhythm: Normal rate.     Pulses:          Dorsalis pedis pulses are 2+ on the right side and 2+ on the  left side.  Pulmonary:     Effort: Pulmonary effort is normal.  Musculoskeletal:     Cervical back: Normal range of motion.     Right hip: Normal.     Left hip: Normal.     Right knee: Swelling and ecchymosis present. Decreased range of motion. Tenderness present over the medial joint line. Normal pulse.     Left knee: No swelling or ecchymosis. Normal range of motion. Tenderness present over the medial joint line. Normal pulse.     Right ankle: Normal.     Left ankle: Normal.     Right foot: Normal.     Left foot: Normal.  Skin:    General: Skin is warm and dry.  Neurological:     Mental Status: He is alert.     ED Results / Procedures / Treatments    Radiology DG Knee 2 Views Left  Result Date: 03/09/2019 CLINICAL DATA:  Pain status post fall EXAM: LEFT KNEE - 1-2 VIEW COMPARISON:  None. FINDINGS: No evidence of fracture, dislocation, or joint effusion. No evidence of arthropathy or other focal bone abnormality. Soft tissues are unremarkable. IMPRESSION: Negative. Electronically Signed   By: Constance Holster M.D.   On: 03/09/2019 23:45   DG Knee 2 Views Right  Result Date: 03/09/2019 CLINICAL DATA:  Pain EXAM: RIGHT KNEE - 1-2 VIEW COMPARISON:  None. FINDINGS: No evidence of fracture, dislocation, or  joint effusion. No evidence of arthropathy or other focal bone abnormality. Soft tissues are unremarkable. IMPRESSION: Negative. Electronically Signed   By: Constance Holster M.D.   On: 03/09/2019 23:44   DG Ankle 2 Views Right  Result Date: 03/09/2019 CLINICAL DATA:  Pain status post fall EXAM: RIGHT ANKLE - 2 VIEW COMPARISON:  None. FINDINGS: There is no evidence of fracture, dislocation, or joint effusion. There is no evidence of arthropathy or other focal bone abnormality. Soft tissues are unremarkable. Vascular calcifications are noted. IMPRESSION: Negative. Electronically Signed   By: Constance Holster M.D.   On: 03/09/2019 23:45    Procedures Procedures (including critical care time)  Medications Ordered in ED Medications  oxyCODONE (Oxy IR/ROXICODONE) immediate release tablet 10 mg (10 mg Oral Given 03/10/19 [id number])    ED Course  I have reviewed the triage vital signs and the nursing notes.  Pertinent labs & imaging results that were available during my care of the patient were reviewed by me and considered in my medical decision making (see chart for details).  Clinical Course as of Mar 10 511  Sun Mar 10, 2019  0458 Hx of HTN  BP(!): 141/102 [HM]    Clinical Course User Index [HM] Evertt Chouinard, Gwenlyn Perking   MDM Rules/Calculators/A&P                      Patient X-Rays negative for obvious fracture or dislocation.  I personally evaluated these images.  Pain managed in ED. I doubt tibial plateau fracture given that patient was able to ambulate earlier in the day.  No joint effusion is noted.  Patient will need close follow-up with primary care for reevaluation in approximately 3 days.  Pt also advised to follow up with orthopedics if symptoms persist for possibility of missed fracture diagnosis. Patient given brace and crutches while in ED, conservative therapy recommended and discussed. Patient will be dc home & is agreeable with above plan.   Final Clinical Impression(s)  / ED Diagnoses Final diagnoses:  Fall, initial encounter  Acute pain of  both knees    Rx / DC Orders ED Discharge Orders         Ordered    diclofenac Sodium (VOLTAREN) 1 % GEL  4 times daily     03/10/19 0507    HYDROcodone-acetaminophen (NORCO/VICODIN) 5-325 MG tablet  Every 6 hours PRN     03/10/19 0507           Shay Bartoli, Jarrett Soho, PA-C 03/10/19 [id number]    Ripley Fraise, MD 03/10/19 [phone]

## 2019-03-10 NOTE — Discharge Instructions (Signed)
1. Medications: Voltaren Gel, alternate tylenol and ibuprofen for pain control, Vicodin for breakthrough pain, usual home medications 2. Treatment: rest, ice, elevate and use brace, drink plenty of fluids, gentle stretching 3. Follow Up: Please followup with your PCP in 3 days. If no improvement, please see orthopedics in approximately 1 week; Please return to the ER for worsening symptoms, continued inability to walk, worsening swelling or pain or other concerns

## 2019-03-12 ENCOUNTER — Other Ambulatory Visit: Payer: Self-pay

## 2019-03-12 ENCOUNTER — Encounter: Payer: Self-pay | Admitting: Internal Medicine

## 2019-03-12 ENCOUNTER — Ambulatory Visit (INDEPENDENT_AMBULATORY_CARE_PROVIDER_SITE_OTHER): Payer: 59 | Admitting: Internal Medicine

## 2019-03-12 VITALS — BP 128/82 | HR 65 | Temp 98.8°F | Ht 73.0 in | Wt 236.6 lb

## 2019-03-12 DIAGNOSIS — M25561 Pain in right knee: Secondary | ICD-10-CM | POA: Diagnosis not present

## 2019-03-12 DIAGNOSIS — R739 Hyperglycemia, unspecified: Secondary | ICD-10-CM

## 2019-03-12 DIAGNOSIS — I10 Essential (primary) hypertension: Secondary | ICD-10-CM

## 2019-03-12 DIAGNOSIS — Z23 Encounter for immunization: Secondary | ICD-10-CM | POA: Diagnosis not present

## 2019-03-12 DIAGNOSIS — M25562 Pain in left knee: Secondary | ICD-10-CM | POA: Diagnosis not present

## 2019-03-12 DIAGNOSIS — Z0001 Encounter for general adult medical examination with abnormal findings: Secondary | ICD-10-CM

## 2019-03-12 MED ORDER — HYDROCODONE-ACETAMINOPHEN 5-325 MG PO TABS: 1.0000 | ORAL_TABLET | Freq: Four times a day (QID) | ORAL | 0 refills | Status: DC | PRN

## 2019-03-12 NOTE — Assessment & Plan Note (Signed)
stable overall by history and exam, recent data reviewed with pt, and pt to continue medical treatment as before,  to f/u any worsening symptoms or concerns  

## 2019-03-12 NOTE — Patient Instructions (Signed)
Please take all new medication as prescribed - the hydrocodone as needed  You are given the work note today  Please continue all other medications as before, and refills have been done if requested.  Please have the pharmacy call with any other refills you may need.  Please continue your efforts at being more active, low cholesterol diet, and weight control.  Please keep your appointments with your specialists as you may have planned

## 2019-03-12 NOTE — Addendum Note (Signed)
Addended by: Otilio Miu on: 03/12/2019 04:47 PM   Modules accepted: Orders

## 2019-03-12 NOTE — Assessment & Plan Note (Signed)
Bilateral pain worse with right knee effusion and leg swelling persistent, for contd pain control, work note, and f/u ortho as planned

## 2019-03-12 NOTE — Progress Notes (Signed)
Subjective:    Patient ID: Chris Hunt, male    DOB: Jun 16, 1965, 54 y.o.   MRN: [id number]  HPI  Here to f/u after fall on ice with sort a back bend as hit both knees on ice/concrete, right more than left, finally had to see ED jan 9, films neg, but has persistent pain right > left with swelling to right knee and distal leg, pain now 7/10 (was 10/10 when went to ED); tx with hydrocodone prn with 2 pills left, now able to walk with braces and crutches, but not able to return to work, already has appt with ortho mon jan 18, needs further pain control and work note. No fever, hx of gout and no further falls.  No new complaints Past Medical History:  Diagnosis Date  . Allergy    Allergic rhinitis  . GERD (gastroesophageal reflux disease)   . Hypertension   . Tobacco abuse    Past Surgical History:  Procedure Laterality Date  . BACK SURGERY    . ESOPHAGUS SURGERY  84   " holes in esophagus"  . LUMBAR LAMINECTOMY/DECOMPRESSION MICRODISCECTOMY N/A 05/04/2012   Procedure: LUMBAR LAMINECTOMY/DECOMPRESSION MICRODISCECTOMY 2 LEVELS;  Surgeon: Floyce Stakes, MD;  Location: [facility];  Service: Neurosurgery;  Laterality: N/A;  Bilateral Lumbar Five-Sacral One Diskectomy/Left Lumbar Four-Five Foraminotomy     reports that he has been smoking cigarettes. He has been smoking about 1.00 pack per day. He has never used smokeless tobacco. He reports current alcohol use of about 12.0 standard drinks of alcohol per week. He reports that he does not use drugs. family history includes Cancer in his father; Diabetes in his mother; Heart failure in his maternal grandmother; Hypertension in his mother. Allergies  Allergen Reactions  . Shellfish Allergy Anaphylaxis  . Lactose Intolerance (Gi) Diarrhea   Current Outpatient Medications on File Prior to Visit  Medication Sig Dispense Refill  . acetaminophen (TYLENOL) 650 MG CR tablet Take 650 mg by mouth every 8 (eight) hours as needed for pain.    .  cetirizine (ZYRTEC) 10 MG tablet Take 1 tablet (10 mg total) by mouth daily. 90 tablet 3  . cyclobenzaprine (FLEXERIL) 5 MG tablet Take 1 tablet (5 mg total) by mouth 3 (three) times daily as needed for muscle spasms. 40 tablet 1  . metoprolol tartrate (LOPRESSOR) 50 MG tablet Take 1 tablet (50 mg total) by mouth 2 (two) times daily. 180 tablet 3  . Oxycodone HCl 10 MG TABS 1 by mouth every 6 hrs as needed for mod to severe pain 120 tablet 0  . pantoprazole (PROTONIX) 40 MG tablet Take 1 tablet (40 mg total) by mouth daily. 90 tablet 3  . sildenafil (VIAGRA) 100 MG tablet Take 0.5-1 tablets (50-100 mg total) by mouth daily as needed for erectile dysfunction. 5 tablet 11  . Vitamin D, Ergocalciferol, (DRISDOL) 1.25 MG (50000 UT) CAPS capsule Take 1 capsule (50,000 Units total) by mouth every 7 (seven) days. 12 capsule 0  . diclofenac Sodium (VOLTAREN) 1 % GEL Apply 2 g topically 4 (four) times daily. (Patient not taking: Reported on 03/12/2019) 100 g 0  . gabapentin (NEURONTIN) 100 MG capsule Take 1 capsule (100 mg total) by mouth 3 (three) times daily. (Patient not taking: Reported on 01/14/2019) 90 capsule 5  . ibuprofen (ADVIL,MOTRIN) 800 MG tablet Take 1 tablet (800 mg total) by mouth 3 (three) times daily. (Patient not taking: Reported on 01/14/2019) 21 tablet 0  . testosterone (ANDROGEL) 50  MG/5GM (1%) GEL Place 5 g onto the skin daily. (Patient not taking: Reported on 01/14/2019) 450 g 1  . traMADol (ULTRAM) 50 MG tablet Take 1 tablet (50 mg total) by mouth every 8 (eight) hours as needed. (Patient not taking: Reported on 01/14/2019) 60 tablet 1   Current Facility-Administered Medications on File Prior to Visit  Medication Dose Route Frequency Provider Last Rate Last Admin  . 0.9 %  sodium chloride infusion  500 mL Intravenous Once Gatha Mayer, MD       Review of Systems  Constitutional: Negative for other unusual diaphoresis or sweats HENT: Negative for ear discharge or swelling Eyes:  Negative for other worsening visual disturbances Respiratory: Negative for stridor or other swelling  Gastrointestinal: Negative for worsening distension or other blood Genitourinary: Negative for retention or other urinary change Musculoskeletal: Negative for other MSK pain or swelling Skin: Negative for color change or other new lesions Neurological: Negative for worsening tremors and other numbness  Psychiatric/Behavioral: Negative for worsening agitation or other fatigue All otherwise neg per pt     Objective:   Physical Exam BP 128/82   Pulse 65   Temp 98.8 F (37.1 C)   Ht 6\' 1"  (1.854 m)   Wt 236 lb 9.6 oz (107.3 kg)   SpO2 98%   BMI 31.22 kg/m  VS noted,  Constitutional: Pt appears in NAD HENT: Head: NCAT.  Right Ear: External ear normal.  Left Ear: External ear normal.  Eyes: . Pupils are equal, round, and reactive to light. Conjunctivae and EOM are normal Nose: without d/c or deformity Neck: Neck supple. Gross normal ROM Cardiovascular: Normal rate and regular rhythm.   Pulmonary/Chest: Effort normal and breath sounds without rales or wheezing.  Right knee with trace effusion and distal RLE swelling, reduced ROM and warm to medial aspect Left knee without effusion or distal leg swelling but also reduced ROM Neurological: Pt is alert. At baseline orientation, motor grossly intact Skin: Skin is warm. No rashes, other new lesions, no LE edema Psychiatric: Pt behavior is normal without agitation  All otherwise neg per pt Lab Results  Component Value Date   WBC 7.7 07/18/2018   HGB 14.9 07/18/2018   HCT 43.3 07/18/2018   PLT 183.0 07/18/2018   GLUCOSE 101 (H) 07/18/2018   CHOL 180 07/18/2018   TRIG 175.0 (H) 07/18/2018   HDL 64.70 07/18/2018   LDLCALC 80 07/18/2018   ALT 23 07/18/2018   AST 18 07/18/2018   NA 136 07/18/2018   K 3.4 (L) 07/18/2018   CL 102 07/18/2018   CREATININE 1.07 07/18/2018   BUN 13 07/18/2018   CO2 24 07/18/2018   TSH 0.62 07/18/2018    PSA 1.51 07/18/2018   HGBA1C 6.3 07/18/2018         Assessment & Plan:

## 2019-03-18 ENCOUNTER — Telehealth: Payer: Self-pay | Admitting: Internal Medicine

## 2019-03-18 NOTE — Telephone Encounter (Signed)
Patient has been informed and understood. He will ask his employer and have them fax any paperwork they want completed. Fax # given.

## 2019-03-18 NOTE — Telephone Encounter (Signed)
Unfortunately we are unable to be more specific due to HIPAA law

## 2019-03-18 NOTE — Telephone Encounter (Signed)
Patient states he needs letter for work saying why he was out of work on 1/8/ to 1/19.  Patient will pick up letter at the office.

## 2019-04-02 ENCOUNTER — Other Ambulatory Visit: Payer: Self-pay | Admitting: Internal Medicine

## 2019-08-10 ENCOUNTER — Other Ambulatory Visit: Payer: Self-pay | Admitting: Internal Medicine

## 2019-08-10 NOTE — Telephone Encounter (Signed)
Please refill as per office routine med refill policy (all routine meds refilled for 3 mo or monthly per pt preference up to one year from last visit, then month to month grace period for 3 mo, then further med refills will have to be denied)  

## 2020-06-02 ENCOUNTER — Other Ambulatory Visit: Payer: Self-pay

## 2020-06-02 ENCOUNTER — Encounter: Payer: Self-pay | Admitting: Internal Medicine

## 2020-06-02 ENCOUNTER — Ambulatory Visit (INDEPENDENT_AMBULATORY_CARE_PROVIDER_SITE_OTHER): Payer: Self-pay | Admitting: Internal Medicine

## 2020-06-02 VITALS — BP 138/70 | HR 55 | Temp 98.2°F | Ht 73.0 in | Wt 222.6 lb

## 2020-06-02 DIAGNOSIS — R739 Hyperglycemia, unspecified: Secondary | ICD-10-CM

## 2020-06-02 DIAGNOSIS — I10 Essential (primary) hypertension: Secondary | ICD-10-CM

## 2020-06-02 DIAGNOSIS — F17200 Nicotine dependence, unspecified, uncomplicated: Secondary | ICD-10-CM

## 2020-06-02 DIAGNOSIS — E559 Vitamin D deficiency, unspecified: Secondary | ICD-10-CM

## 2020-06-02 NOTE — Progress Notes (Signed)
Patient ID: YASIN DUCAT, male   DOB: [birth year], 55 y.o.   MRN: [id number]        Chief Complaint: HTN and low vit d       HPI:  Chris Hunt is a 55 y.o. male here with BP elevated with DOT physical 2 mo ago, now taking medication on a regular basis, BP improved, needs documentation for this to take to DOT so he can be passed, then start work, then be eligible for health insurance and benefits after that.  Not taking Vit D.  Pt denies chest pain, increased sob or doe, wheezing, orthopnea, PND, increased LE swelling, palpitations, dizziness or syncope.       Wt Readings from Last 3 Encounters:  06/02/20 222 lb 9.6 oz (101 kg)  03/12/19 236 lb 9.6 oz (107.3 kg)  01/28/19 236 lb 12.8 oz (107.4 kg)   BP Readings from Last 3 Encounters:  06/02/20 138/70  03/12/19 128/82  03/10/19 (!) 141/102         Past Medical History:  Diagnosis Date  . Allergy    Allergic rhinitis  . GERD (gastroesophageal reflux disease)   . Hypertension   . Tobacco abuse    Past Surgical History:  Procedure Laterality Date  . BACK SURGERY    . ESOPHAGUS SURGERY  84   " holes in esophagus"  . LUMBAR LAMINECTOMY/DECOMPRESSION MICRODISCECTOMY N/A 05/04/2012   Procedure: LUMBAR LAMINECTOMY/DECOMPRESSION MICRODISCECTOMY 2 LEVELS;  Surgeon: Floyce Stakes, MD;  Location: [facility];  Service: Neurosurgery;  Laterality: N/A;  Bilateral Lumbar Five-Sacral One Diskectomy/Left Lumbar Four-Five Foraminotomy     reports that he has been smoking cigarettes. He has been smoking about 1.00 pack per day. He has never used smokeless tobacco. He reports current alcohol use of about 12.0 standard drinks of alcohol per week. He reports that he does not use drugs. family history includes Cancer in his father; Diabetes in his mother; Heart failure in his maternal grandmother; Hypertension in his mother. Allergies  Allergen Reactions  . Shellfish Allergy Anaphylaxis  . Lactose Intolerance (Gi) Diarrhea   Current  Outpatient Medications on File Prior to Visit  Medication Sig Dispense Refill  . acetaminophen (TYLENOL) 650 MG CR tablet Take 650 mg by mouth every 8 (eight) hours as needed for pain.    Marland Kitchen ibuprofen (ADVIL,MOTRIN) 800 MG tablet Take 1 tablet (800 mg total) by mouth 3 (three) times daily. 21 tablet 0  . metoprolol tartrate (LOPRESSOR) 50 MG tablet TAKE 1 TABLET BY MOUTH TWICE DAILY 180 tablet 3  . cetirizine (ZYRTEC) 10 MG tablet Take 1 tablet (10 mg total) by mouth daily. 90 tablet 3  . diclofenac Sodium (VOLTAREN) 1 % GEL Apply 2 g topically 4 (four) times daily. (Patient not taking: Reported on 06/02/2020) 100 g 0  . sildenafil (VIAGRA) 100 MG tablet Take 0.5-1 tablets (50-100 mg total) by mouth daily as needed for erectile dysfunction. (Patient not taking: Reported on 06/02/2020) 5 tablet 11   No current facility-administered medications on file prior to visit.        ROS:  All others reviewed and negative.  Objective        PE:  BP 138/70 (BP Location: Right Arm, Patient Position: Sitting, Cuff Size: Large)   Pulse (!) 55   Temp 98.2 F (36.8 C) (Oral)   Ht 6\' 1"  (1.854 m)   Wt 222 lb 9.6 oz (101 kg)   SpO2 97%   BMI 29.37 kg/m  Constitutional: Pt appears in NAD               HENT: Head: NCAT.                Right Ear: External ear normal.                 Left Ear: External ear normal.                Eyes: . Pupils are equal, round, and reactive to light. Conjunctivae and EOM are normal               Nose: without d/c or deformity               Neck: Neck supple. Gross normal ROM               Cardiovascular: Normal rate and regular rhythm.                 Pulmonary/Chest: Effort normal and breath sounds without rales or wheezing.                Abd:  Soft, NT, ND, + BS, no organomegaly               Neurological: Pt is alert. At baseline orientation, motor grossly intact               Skin: Skin is warm. No rashes, no other new lesions, LE edema - none                Psychiatric: Pt behavior is normal without agitation   Micro: none  Cardiac tracings I have personally interpreted today:  none  Pertinent Radiological findings (summarize): none   Lab Results  Component Value Date   WBC 7.7 07/18/2018   HGB 14.9 07/18/2018   HCT 43.3 07/18/2018   PLT 183.0 07/18/2018   GLUCOSE 101 (H) 07/18/2018   CHOL 180 07/18/2018   TRIG 175.0 (H) 07/18/2018   HDL 64.70 07/18/2018   LDLCALC 80 07/18/2018   ALT 23 07/18/2018   AST 18 07/18/2018   NA 136 07/18/2018   K 3.4 (L) 07/18/2018   CL 102 07/18/2018   CREATININE 1.07 07/18/2018   BUN 13 07/18/2018   CO2 24 07/18/2018   TSH 0.62 07/18/2018   PSA 1.51 07/18/2018   HGBA1C 6.3 07/18/2018   Assessment/Plan:  Chris Hunt is a 55 y.o. Black or African American [2] male with  has a past medical history of Allergy, GERD (gastroesophageal reflux disease), Hypertension, and Tobacco abuse.  Vitamin D deficiency Last vitamin D Lab Results  Component Value Date   VD25OH 16.16 (L) 07/18/2018   Low, to start oral replacement  TOBACCO USER Counseled to quit  Hyperglycemia Lab Results  Component Value Date   HGBA1C 6.3 07/18/2018   Stable, pt to continue current medical treatment  - diet   HTN (hypertension) BP Readings from Last 3 Encounters:  06/02/20 138/70  03/12/19 128/82  03/10/19 (!) 141/102   Stable, pt to continue medical treatment  - lopressor   Current Outpatient Medications (Cardiovascular):  .  metoprolol tartrate (LOPRESSOR) 50 MG tablet, TAKE 1 TABLET BY MOUTH TWICE DAILY .  sildenafil (VIAGRA) 100 MG tablet, Take 0.5-1 tablets (50-100 mg total) by mouth daily as needed for erectile dysfunction. (Patient not taking: Reported on 06/02/2020)  Current Outpatient Medications (Respiratory):  .  cetirizine (ZYRTEC) 10 MG tablet, Take 1 tablet (10 mg total) by mouth daily.  Current Outpatient Medications (Analgesics):  .  acetaminophen (TYLENOL) 650 MG CR tablet, Take 650  mg by mouth every 8 (eight) hours as needed for pain. Marland Kitchen  ibuprofen (ADVIL,MOTRIN) 800 MG tablet, Take 1 tablet (800 mg total) by mouth 3 (three) times daily.   Current Outpatient Medications (Other):  .  diclofenac Sodium (VOLTAREN) 1 % GEL, Apply 2 g topically 4 (four) times daily. (Patient not taking: Reported on 06/02/2020)   Followup: Return in about 6 months (around 12/02/2020).  Cathlean Cower, MD 06/07/2020 1:50 AM [facility]

## 2020-06-02 NOTE — Patient Instructions (Signed)
Your Blood Pressure was consider well controlled today  Please continue all other medications as before, and refills have been done if requested.  Please have the pharmacy call with any other refills you may need.  Please continue your efforts at being more active, low cholesterol diet, and weight control  Please keep your appointments with your specialists as you may have planned  Please make an Appointment to return in 6 months, or sooner if needed

## 2020-06-07 ENCOUNTER — Encounter: Payer: Self-pay | Admitting: Internal Medicine

## 2020-06-07 NOTE — Assessment & Plan Note (Signed)
Lab Results  Component Value Date   HGBA1C 6.3 07/18/2018   Stable, pt to continue current medical treatment  - diet

## 2020-06-07 NOTE — Assessment & Plan Note (Signed)
BP Readings from Last 3 Encounters:  06/02/20 138/70  03/12/19 128/82  03/10/19 (!) 141/102   Stable, pt to continue medical treatment  - lopressor   Current Outpatient Medications (Cardiovascular):  .  metoprolol tartrate (LOPRESSOR) 50 MG tablet, TAKE 1 TABLET BY MOUTH TWICE DAILY .  sildenafil (VIAGRA) 100 MG tablet, Take 0.5-1 tablets (50-100 mg total) by mouth daily as needed for erectile dysfunction. (Patient not taking: Reported on 06/02/2020)  Current Outpatient Medications (Respiratory):  .  cetirizine (ZYRTEC) 10 MG tablet, Take 1 tablet (10 mg total) by mouth daily.  Current Outpatient Medications (Analgesics):  .  acetaminophen (TYLENOL) 650 MG CR tablet, Take 650 mg by mouth every 8 (eight) hours as needed for pain. Marland Kitchen  ibuprofen (ADVIL,MOTRIN) 800 MG tablet, Take 1 tablet (800 mg total) by mouth 3 (three) times daily.   Current Outpatient Medications (Other):  .  diclofenac Sodium (VOLTAREN) 1 % GEL, Apply 2 g topically 4 (four) times daily. (Patient not taking: Reported on 06/02/2020)

## 2020-06-07 NOTE — Assessment & Plan Note (Signed)
Counseled to quit 

## 2020-06-07 NOTE — Assessment & Plan Note (Signed)
Last vitamin D Lab Results  Component Value Date   VD25OH 16.16 (L) 07/18/2018   Low, to start oral replacement

## 2020-06-08 ENCOUNTER — Telehealth: Payer: Self-pay | Admitting: Internal Medicine

## 2020-06-08 NOTE — Telephone Encounter (Signed)
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 He can be reached at [phone]. He said that it was okay to LVM.

## 2020-06-08 NOTE — Telephone Encounter (Signed)
I saw a form but the instructions were nonsensical  There was some confusing language about release of medical records  ???  But you were already given the form at the end of the visi with the BP from the visit on it  The only information to be filled in on the form was the PATIENT to be filled out at the bottom I think to authorize information release  I could not understand what the request was for, and there was nothing for me on the form for me to fill out

## 2020-06-09 NOTE — Telephone Encounter (Signed)
Patient notified that the form is complete and ready for pick up at the front desk via voicemail.

## 2020-08-23 ENCOUNTER — Other Ambulatory Visit: Payer: Self-pay | Admitting: Internal Medicine

## 2020-08-23 NOTE — Telephone Encounter (Signed)
Please refill as per office routine med refill policy (all routine meds refilled for 3 mo or monthly per pt preference up to one year from last visit, then month to month grace period for 3 mo, then further med refills will have to be denied)  

## 2020-12-02 ENCOUNTER — Ambulatory Visit: Payer: Self-pay | Admitting: Internal Medicine

## 2020-12-23 ENCOUNTER — Emergency Department (HOSPITAL_BASED_OUTPATIENT_CLINIC_OR_DEPARTMENT_OTHER): Payer: Self-pay | Admitting: Radiology

## 2020-12-23 ENCOUNTER — Other Ambulatory Visit: Payer: Self-pay

## 2020-12-23 ENCOUNTER — Emergency Department (HOSPITAL_BASED_OUTPATIENT_CLINIC_OR_DEPARTMENT_OTHER)
Admission: EM | Admit: 2020-12-23 | Discharge: 2020-12-23 | Disposition: A | Discharge status: Home / Self Care | Payer: Self-pay | Attending: Emergency Medicine | Admitting: Emergency Medicine

## 2020-12-23 ENCOUNTER — Encounter (HOSPITAL_BASED_OUTPATIENT_CLINIC_OR_DEPARTMENT_OTHER): Payer: Self-pay

## 2020-12-23 DIAGNOSIS — I10 Essential (primary) hypertension: Secondary | ICD-10-CM | POA: Insufficient documentation

## 2020-12-23 DIAGNOSIS — F17210 Nicotine dependence, cigarettes, uncomplicated: Secondary | ICD-10-CM | POA: Insufficient documentation

## 2020-12-23 DIAGNOSIS — J209 Acute bronchitis, unspecified: Secondary | ICD-10-CM | POA: Insufficient documentation

## 2020-12-23 DIAGNOSIS — Z20822 Contact with and (suspected) exposure to covid-19: Secondary | ICD-10-CM | POA: Insufficient documentation

## 2020-12-23 LAB — RESP PANEL BY RT-PCR (FLU A&B, COVID) ARPGX2
Influenza A by PCR: NEGATIVE
Influenza B by PCR: NEGATIVE
SARS Coronavirus 2 by RT PCR: NEGATIVE

## 2020-12-23 MED ORDER — ALBUTEROL SULFATE HFA 108 (90 BASE) MCG/ACT IN AERS
2.0000 | INHALATION_SPRAY | RESPIRATORY_TRACT | Status: DC | PRN
  Administered 2020-12-23: 2 via RESPIRATORY_TRACT
  Filled 2020-12-23: qty 6.7

## 2020-12-23 MED ORDER — AEROCHAMBER PLUS FLO-VU LARGE MISC
1.0000 | Freq: Once | Status: AC
  Administered 2020-12-23: 1
  Filled 2020-12-23: qty 1

## 2020-12-23 NOTE — ED Provider Notes (Signed)
[facility] Provider Note: Georgena Spurling, MD, FACEP  CSN: [id number] MRN: [id number] ARRIVAL: 12/23/20 at [place]: [place]  Cough   HISTORY OF PRESENT ILLNESS  12/23/20 4:57 AM Chris Hunt is a 55 y.o. male with a to 10 days of cough and chest congestion.  He has also had rhinorrhea and nasal congestion.  His cough has been productive but he has not had a fever.  He has had some shortness of breath with this as well.  He has been taking DayQuil and NyQuil without adequate relief.  Symptoms are moderate.   Past Medical History:  Diagnosis Date   Allergy    Allergic rhinitis   GERD (gastroesophageal reflux disease)    Hypertension    Tobacco abuse     Past Surgical History:  Procedure Laterality Date   BACK SURGERY     ESOPHAGUS SURGERY  84   " holes in esophagus"   LUMBAR LAMINECTOMY/DECOMPRESSION MICRODISCECTOMY N/A 05/04/2012   Procedure: LUMBAR LAMINECTOMY/DECOMPRESSION MICRODISCECTOMY 2 LEVELS;  Surgeon: Floyce Stakes, MD;  Location: [facility];  Service: Neurosurgery;  Laterality: N/A;  Bilateral Lumbar Five-Sacral One Diskectomy/Left Lumbar Four-Five Foraminotomy     Family History  Problem Relation Age of Onset   Cancer Father    Diabetes Mother    Hypertension Mother    Heart failure Maternal Grandmother    Colon cancer Neg Hx    Colon polyps Neg Hx    Esophageal cancer Neg Hx    Rectal cancer Neg Hx    Stomach cancer Neg Hx     Social History   Tobacco Use   Smoking status: Every Day    Packs/day: 0.50    Types: Cigarettes   Smokeless tobacco: Never  Vaping Use   Vaping Use: Never used  Substance Use Topics   Alcohol use: Yes    Alcohol/week: 12.0 standard drinks    Types: 12 Cans of beer per week    Comment: 3-4 week/liquor   Drug use: No    Prior to Admission medications   Medication Sig Start Date End Date Taking? Authorizing Provider  metoprolol tartrate (LOPRESSOR) 50 MG tablet TAKE 1 TABLET BY  MOUTH TWICE DAILY 08/24/20   Biagio Borg, MD    Allergies Shellfish allergy and Lactose intolerance (gi)   REVIEW OF SYSTEMS  Negative except as noted here or in the History of Present Illness.   PHYSICAL EXAMINATION  Initial Vital Signs Blood pressure (!) 153/106, pulse (!) 57, temperature 97.9 F (36.6 C), temperature source Oral, resp. rate 16, height 6\' 1"  (1.854 m), weight 101 kg, SpO2 97 %.  Examination General: Well-developed, well-nourished male in no acute distress; appearance consistent with age of record HENT: normocephalic; atraumatic Eyes: Normal appearance Neck: supple Heart: regular rate and rhythm Lungs: clear to auscultation bilaterally Abdomen: soft; nondistended; nontender; bowel sounds present Extremities: No deformity; full range of motion; pulses normal Neurologic: Somnolent but arousable; motor function intact in all extremities and symmetric; no facial droop Skin: Warm and dry Psychiatric: Normal mood and affect   RESULTS  Summary of this visit's results, reviewed and interpreted by myself:   EKG Interpretation  Date/Time:    Ventricular Rate:    PR Interval:    QRS Duration:   QT Interval:    QTC Calculation:   R Axis:     Text Interpretation:         Laboratory Studies: Results for orders placed or performed during  the hospital encounter of 12/23/20 (from the past 24 hour(s))  Resp Panel by RT-PCR (Flu A&B, Covid) Nasopharyngeal Swab     Status: None   Collection Time: 12/23/20  3:49 AM   Specimen: Nasopharyngeal Swab; Nasopharyngeal(NP) swabs in vial transport medium  Result Value Ref Range   SARS Coronavirus 2 by RT PCR NEGATIVE NEGATIVE   Influenza A by PCR NEGATIVE NEGATIVE   Influenza B by PCR NEGATIVE NEGATIVE   Imaging Studies: DG Chest 2 View  Result Date: 12/23/2020 CLINICAL DATA:  Cough EXAM: CHEST - 2 VIEW COMPARISON:  01/28/2016 FINDINGS: Normal heart size and mediastinal contours. No acute infiltrate or edema. No  effusion or pneumothorax. No acute osseous findings. IMPRESSION: No active cardiopulmonary disease. Electronically Signed   By: Jorje Guild M.D.   On: 12/23/2020 04:43    ED COURSE and MDM  Nursing notes, initial and subsequent vitals signs, including pulse oximetry, reviewed and interpreted by myself.  Vitals:   12/23/20 0343 12/23/20 0344  BP: (!) 153/106   Pulse: (!) 57   Resp: 16   Temp: 97.9 F (36.6 C)   TempSrc: Oral   SpO2: 97%   Weight:  101 kg  Height:  6\' 1"  (1.854 m)   Medications  albuterol (VENTOLIN HFA) 108 (90 Base) MCG/ACT inhaler 2 puff (has no administration in time range)  aerochamber plus with mask device 1 each (has no administration in time range)    Although the patient is not wheezing currently he will likely benefit from an albuterol inhaler.  I do not believe antibiotics would be beneficial at this time as he is not febrile nor are there any infiltrate seen on chest x-ray.  PROCEDURES  Procedures   ED DIAGNOSES     ICD-10-CM   1. Acute bronchitis with bronchospasm  J20.9          Trashaun Streight, Jenny Reichmann, MD 12/23/20 [phone]

## 2020-12-23 NOTE — ED Triage Notes (Signed)
Patient here POV from Home with Congestion and Cough.  Patient has had symptoms for approximately 8-10 days. No Known Sick Contacts. No Known Fevers.   A&Ox4. GCS 15. NAD Noted during Triage. Airway Intact.

## 2021-02-07 ENCOUNTER — Emergency Department (HOSPITAL_BASED_OUTPATIENT_CLINIC_OR_DEPARTMENT_OTHER): Payer: Self-pay

## 2021-02-07 ENCOUNTER — Other Ambulatory Visit: Payer: Self-pay

## 2021-02-07 ENCOUNTER — Encounter (HOSPITAL_BASED_OUTPATIENT_CLINIC_OR_DEPARTMENT_OTHER): Payer: Self-pay | Admitting: Emergency Medicine

## 2021-02-07 ENCOUNTER — Emergency Department (HOSPITAL_BASED_OUTPATIENT_CLINIC_OR_DEPARTMENT_OTHER)
Admission: EM | Admit: 2021-02-07 | Discharge: 2021-02-07 | Disposition: A | Discharge status: Home / Self Care | Payer: Self-pay | Attending: Emergency Medicine | Admitting: Emergency Medicine

## 2021-02-07 DIAGNOSIS — J069 Acute upper respiratory infection, unspecified: Secondary | ICD-10-CM | POA: Insufficient documentation

## 2021-02-07 DIAGNOSIS — I10 Essential (primary) hypertension: Secondary | ICD-10-CM | POA: Insufficient documentation

## 2021-02-07 DIAGNOSIS — Z20822 Contact with and (suspected) exposure to covid-19: Secondary | ICD-10-CM | POA: Insufficient documentation

## 2021-02-07 DIAGNOSIS — F17210 Nicotine dependence, cigarettes, uncomplicated: Secondary | ICD-10-CM | POA: Insufficient documentation

## 2021-02-07 DIAGNOSIS — Z79899 Other long term (current) drug therapy: Secondary | ICD-10-CM | POA: Insufficient documentation

## 2021-02-07 DIAGNOSIS — Z7952 Long term (current) use of systemic steroids: Secondary | ICD-10-CM | POA: Insufficient documentation

## 2021-02-07 DIAGNOSIS — J3489 Other specified disorders of nose and nasal sinuses: Secondary | ICD-10-CM | POA: Insufficient documentation

## 2021-02-07 LAB — RESP PANEL BY RT-PCR (FLU A&B, COVID) ARPGX2
Influenza A by PCR: NEGATIVE
Influenza B by PCR: NEGATIVE
SARS Coronavirus 2 by RT PCR: NEGATIVE

## 2021-02-07 MED ORDER — GUAIFENESIN-CODEINE 100-10 MG/5ML PO SOLN: 5.0000 mL | Freq: Two times a day (BID) | ORAL | 0 refills | Status: DC | PRN

## 2021-02-07 MED ORDER — KETOROLAC TROMETHAMINE 60 MG/2ML IM SOLN
60.0000 mg | Freq: Once | INTRAMUSCULAR | Status: AC
  Administered 2021-02-07: 60 mg via INTRAMUSCULAR
  Filled 2021-02-07: qty 2

## 2021-02-07 MED ORDER — FLUTICASONE PROPIONATE 50 MCG/ACT NA SUSP: 1.0000 | Freq: Every day | NASAL | 0 refills | Status: AC

## 2021-02-07 MED ORDER — PSEUDOEPHEDRINE HCL 30 MG PO TABS: 30.0000 mg | ORAL_TABLET | Freq: Three times a day (TID) | ORAL | 0 refills | Status: DC | PRN

## 2021-02-07 NOTE — ED Triage Notes (Signed)
Pt presents to ED POV. Pt c/o productive cough and rhinorrhea x6 days. Pt denies any sick contacts.

## 2021-02-07 NOTE — ED Provider Notes (Signed)
[facility] Provider Note   CSN: [id number] Arrival date & time: 02/07/21  1937     History Chief Complaint  Patient presents with   Cough    Chris Hunt is a 55 y.o. male.  This is a 55 y.o. male with significant medical history as below, including htn, tobacco use who presents to the ED with complaint of cough. Pt with ongoing URI s/s for around 1 week. Sinus congestion, post nasal drip, cough with intermittently producing sputum. No fevers or chills, no n/v, he is eating and drinking normally. No significant dyspnea or chest pain. Has been taking OTC cold/flu remedies with mild improvement to symptoms. No rashes, no wheezing. Was given an inhaler in the past 2/2 bronchitis dx and has been using this intermittently without relief of symptoms.   The history is provided by the patient and the spouse. No language interpreter was used.  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   Vitamin D deficiency 07/21/2018   Cervical radiculitis 07/18/2018   Left cervical radiculopathy 06/06/2016   HTN (hypertension) 06/06/2016   SVT (supraventricular tachycardia) ([place]) 02/05/2016   Chest pain 02/05/2016   Pain of left thumb 02/05/2016   Acute upper respiratory infection 01/17/2016   Encounter for well adult exam with abnormal findings 01/13/2016   Lumbar disc disease 01/13/2016   History of gastric bypass 01/13/2016   Hyperglycemia 01/13/2016   Influenza-like syndrome 03/23/2012   Foot pain, left  08/17/2011   Lumbosacral strain 07/26/2011   Right shoulder pain 01/06/2011   Allergic rhinitis 01/05/2011   Cough, persistent 11/09/2010   TOBACCO USER 04/07/2008   GERD 04/07/2008    Past Surgical History:  Procedure Laterality Date   BACK SURGERY     ESOPHAGUS SURGERY  84   " holes in esophagus"   LUMBAR LAMINECTOMY/DECOMPRESSION MICRODISCECTOMY N/A 05/04/2012   Procedure: LUMBAR LAMINECTOMY/DECOMPRESSION MICRODISCECTOMY 2 LEVELS;  Surgeon: Floyce Stakes, MD;  Location: [facility];  Service: Neurosurgery;  Laterality: N/A;  Bilateral Lumbar Five-Sacral One Diskectomy/Left Lumbar Four-Five Foraminotomy        Family History  Problem Relation Age of Onset   Cancer Father    Diabetes Mother    Hypertension Mother    Heart failure Maternal Grandmother    Colon cancer Neg Hx    Colon polyps Neg Hx    Esophageal cancer Neg Hx    Rectal cancer Neg Hx    Stomach cancer Neg Hx     Social History   Tobacco Use   Smoking status: Every Day    Packs/day: 0.50    Types: Cigarettes   Smokeless tobacco: Never  Vaping Use   Vaping Use: Never used  Substance Use Topics   Alcohol use: Yes    Alcohol/week: 12.0 standard drinks    Types: 12 Cans of beer per week    Comment: 3-4 week/liquor   Drug use: No    Home Medications Prior to Admission medications   Medication  Sig Start Date End Date Taking? Authorizing Provider  fluticasone (FLONASE) 50 MCG/ACT nasal spray Place 1 spray into both nostrils daily for 7 days. 02/07/21 02/14/21 Yes Wynona Dove A, DO  guaiFENesin-codeine 100-10 MG/5ML syrup Take 5 mLs by mouth 2 (two) times daily as needed for cough. 02/07/21  Yes Wynona Dove A, DO  pseudoephedrine (SUDAFED) 30 MG tablet Take 1 tablet (30 mg total) by mouth every 8 (eight) hours as needed for congestion. 02/07/21  Yes Wynona Dove A, DO  metoprolol tartrate (LOPRESSOR) 50 MG tablet TAKE 1 TABLET BY MOUTH TWICE DAILY 08/24/20   Biagio Borg, MD    Allergies     Shellfish allergy and Lactose intolerance (gi)  Review of Systems   Review of Systems  Constitutional:  Negative for chills and fever.  HENT:  Positive for congestion, postnasal drip, rhinorrhea and sinus pressure. Negative for facial swelling and trouble swallowing.   Eyes:  Negative for photophobia and visual disturbance.  Respiratory:  Positive for cough. Negative for shortness of breath.   Cardiovascular:  Negative for chest pain and palpitations.  Gastrointestinal:  Negative for abdominal pain, nausea and vomiting.  Endocrine: Negative for polydipsia and polyuria.  Genitourinary:  Negative for difficulty urinating and hematuria.  Musculoskeletal:  Negative for gait problem and joint swelling.  Skin:  Negative for pallor and rash.  Neurological:  Negative for syncope and headaches.  Psychiatric/Behavioral:  Negative for agitation and confusion.    Physical Exam Updated Vital Signs BP (!) 161/111 (BP Location: Right Arm)   Pulse 72   Temp 98.2 F (36.8 C)   Resp 18   Ht 6\' 1"  (1.854 m)   SpO2 98%   BMI 29.38 kg/m   Physical Exam Vitals and nursing note reviewed.  Constitutional:      General: He is not in acute distress.    Appearance: Normal appearance. He is well-developed. He is not ill-appearing.  HENT:     Head: Normocephalic and atraumatic.     Right Ear: External ear normal.     Left Ear: External ear normal.     Nose: Congestion and rhinorrhea present.     Mouth/Throat:     Mouth: Mucous membranes are moist.     Pharynx: Oropharynx is clear. No oropharyngeal exudate.  Eyes:     General: No scleral icterus. Cardiovascular:     Rate and Rhythm: Normal rate and regular rhythm.     Pulses: Normal pulses.     Heart sounds: Normal heart sounds.  Pulmonary:     Effort: Pulmonary effort is normal. No respiratory distress.     Breath sounds: Normal breath sounds. No stridor. No wheezing.  Abdominal:     General: Abdomen is flat.     Palpations: Abdomen is  soft.     Tenderness: There is no abdominal tenderness.  Musculoskeletal:        General: Normal range of motion.     Cervical back: Normal range of motion. No rigidity.     Right lower leg: No edema.     Left lower leg: No edema.  Skin:    General: Skin is warm and dry.     Capillary Refill: Capillary refill takes less than 2 seconds.  Neurological:     Mental Status: He is alert and oriented to person, place, and time.  Psychiatric:        Mood and Affect: Mood normal.        Behavior: Behavior normal.  ED Results / Procedures / Treatments   Labs (all labs ordered are listed, but only abnormal results are displayed) Labs Reviewed  RESP PANEL BY RT-PCR (FLU A&B, COVID) [id number]    EKG None  Radiology DG Chest Portable 1 View  Result Date: 02/07/2021 CLINICAL DATA:  Cough. EXAM: PORTABLE CHEST 1 VIEW COMPARISON:  Chest radiograph dated 12/23/2020. FINDINGS: No focal consolidation, pleural effusion or pneumothorax. Mild cardiomegaly. No acute osseous pathology. Lower cervical ACDF. IMPRESSION: No acute cardiopulmonary process. Electronically Signed   By: Anner Crete M.D.   On: 02/07/2021 20:55    Procedures Procedures   Medications Ordered in ED Medications  ketorolac (TORADOL) injection 60 mg (60 mg Intramuscular Given 02/07/21 2220)    ED Course  I have reviewed the triage vital signs and the nursing notes.  Pertinent labs & imaging results that were available during my care of the patient were reviewed by me and considered in my medical decision making (see chart for details).    MDM Rules/Calculators/A&P                           CC: uri s/s  This patient complains of above; this involves an extensive number of treatment options and is a complaint that carries with it a high risk of complications and morbidity. Vital signs were reviewed. Serious etiologies considered.  Record review:  Previous records obtained and reviewed   Additional history  obtained from spouse   Work up as above, notable for:    Labs & imaging results that were available during my care of the patient were reviewed by me and considered in my medical decision making.   I ordered imaging studies which included cxr and I independently visualized and interpreted imaging which showed no acute changes  Flu/covid 19 negative  Management: Give toradol injection  Reassessment:  Pt resp status is stable, likely has viral URI. Discussed supportive care at home and adjusted his home regimen regarding supportive care. Low suspicion for bacterial infection currently. Low susp for COPD exacerbation given lack of wheezing, dyspnea.   The patient improved significantly and was discharged in stable condition. Detailed discussions were had with the patient regarding current findings, and need for close f/u with PCP or on call doctor. The patient has been instructed to return immediately if the symptoms worsen in any way for re-evaluation. Patient verbalized understanding and is in agreement with current care plan. All questions answered prior to discharge.    Counseled patient for approximately 4 minutes regarding smoking cessation. Discussed risks of smoking and how they applied and affected their visit here today. Patient not ready to quit at this time, however will follow up with their primary doctor when they are.   CPT code: [phone]: intermediate counseling for smoking cessation           This chart was dictated using voice recognition software.  Despite best efforts to proofread,  errors can occur which can change the documentation meaning.  Final Clinical Impression(s) / ED Diagnoses Final diagnoses:  URI with cough and congestion    Rx / DC Orders ED Discharge Orders          Ordered    guaiFENesin-codeine 100-10 MG/5ML syrup  2 times daily PRN        02/07/21 2151    pseudoephedrine (SUDAFED) 30 MG tablet  Every 8 hours PRN        02/07/21 2151  fluticasone (FLONASE) 50 MCG/ACT nasal spray  Daily        02/07/21 2151             Jeanell Sparrow, DO 02/08/21 0127

## 2021-03-24 ENCOUNTER — Other Ambulatory Visit: Payer: Self-pay

## 2021-03-24 ENCOUNTER — Emergency Department (HOSPITAL_BASED_OUTPATIENT_CLINIC_OR_DEPARTMENT_OTHER)
Admission: EM | Admit: 2021-03-24 | Discharge: 2021-03-24 | Disposition: A | Discharge status: Home / Self Care | Payer: Self-pay | Attending: Emergency Medicine | Admitting: Emergency Medicine

## 2021-03-24 ENCOUNTER — Encounter (HOSPITAL_BASED_OUTPATIENT_CLINIC_OR_DEPARTMENT_OTHER): Payer: Self-pay | Admitting: Emergency Medicine

## 2021-03-24 DIAGNOSIS — U071 COVID-19: Secondary | ICD-10-CM | POA: Insufficient documentation

## 2021-03-24 DIAGNOSIS — R051 Acute cough: Secondary | ICD-10-CM

## 2021-03-24 DIAGNOSIS — I10 Essential (primary) hypertension: Secondary | ICD-10-CM | POA: Insufficient documentation

## 2021-03-24 DIAGNOSIS — Z7951 Long term (current) use of inhaled steroids: Secondary | ICD-10-CM | POA: Insufficient documentation

## 2021-03-24 DIAGNOSIS — Z79899 Other long term (current) drug therapy: Secondary | ICD-10-CM | POA: Insufficient documentation

## 2021-03-24 MED ORDER — BENZONATATE 100 MG PO CAPS: 100.0000 mg | ORAL_CAPSULE | Freq: Three times a day (TID) | ORAL | 0 refills | Status: DC | PRN

## 2021-03-24 MED ORDER — ONDANSETRON HCL 4 MG PO TABS: 4.0000 mg | ORAL_TABLET | Freq: Three times a day (TID) | ORAL | 0 refills | Status: DC | PRN

## 2021-03-24 MED ORDER — MOLNUPIRAVIR EUA 200MG CAPSULE: 4.0000 | ORAL_CAPSULE | Freq: Two times a day (BID) | ORAL | 0 refills | Status: AC

## 2021-03-24 MED ORDER — ALBUTEROL SULFATE HFA 108 (90 BASE) MCG/ACT IN AERS
2.0000 | INHALATION_SPRAY | Freq: Once | RESPIRATORY_TRACT | Status: AC
  Administered 2021-03-24: 12:00:00 2 via RESPIRATORY_TRACT
  Filled 2021-03-24: qty 6.7

## 2021-03-24 NOTE — ED Provider Notes (Signed)
[facility] Provider Note   CSN: [id number] Arrival date & time: 03/24/21  2505     History  Chief Complaint  Patient presents with   Generalized Body [place] is a 56 y.o. male.  The history is provided by the patient and medical records. No language interpreter was used.  URI Presenting symptoms: congestion, cough, fatigue, rhinorrhea and sore throat   Presenting symptoms: no fever   Severity:  Moderate Onset quality:  Gradual Duration:  2 days Timing:  Constant Progression:  Unchanged Chronicity:  New Relieved by:  Nothing Worsened by:  Nothing Ineffective treatments:  None tried Associated symptoms: wheezing   Associated symptoms: no headaches and no neck pain   Risk factors: sick contacts       Home Medications Prior to Admission medications   Medication Sig Start Date End Date Taking? Authorizing Provider  fluticasone (FLONASE) 50 MCG/ACT nasal spray Place 1 spray into both nostrils daily for 7 days. 02/07/21 02/14/21  Wynona Dove A, DO  guaiFENesin-codeine 100-10 MG/5ML syrup Take 5 mLs by mouth 2 (two) times daily as needed for cough. 02/07/21   Jeanell Sparrow, DO  metoprolol tartrate (LOPRESSOR) 50 MG tablet TAKE 1 TABLET BY MOUTH TWICE DAILY 08/24/20   Biagio Borg, MD  pseudoephedrine (SUDAFED) 30 MG tablet Take 1 tablet (30 mg total) by mouth every 8 (eight) hours as needed for congestion. 02/07/21   Jeanell Sparrow, DO      Allergies    Shellfish allergy and Lactose intolerance (gi)    Review of Systems   Review of Systems  Constitutional:  Positive for chills and fatigue. Negative for fever.  HENT:  Positive for congestion, rhinorrhea and sore throat.   Eyes:  Negative for visual disturbance.  Respiratory:  Positive for cough and wheezing. Negative for choking, chest tightness, shortness of breath and stridor.   Cardiovascular:  Negative for chest pain and leg swelling.  Gastrointestinal:  Positive for  diarrhea (improved) and nausea. Negative for abdominal pain, constipation and vomiting.  Genitourinary:  Negative for flank pain and frequency.  Musculoskeletal:  Negative for back pain, neck pain and neck stiffness.  Skin:  Negative for rash and wound.  Neurological:  Negative for light-headedness, numbness and headaches.  Psychiatric/Behavioral:  Negative for agitation and confusion.   All other systems reviewed and are negative.  Physical Exam Updated Vital Signs BP (!) 146/111 (BP Location: Right Arm)    Pulse 69    Temp 98.4 F (36.9 C) (Oral)    Resp 18    Ht 6\' 1"  (1.854 m)    Wt 99.8 kg    SpO2 98%    BMI 29.03 kg/m  Physical Exam Vitals and nursing note reviewed.  Constitutional:      General: He is not in acute distress.    Appearance: He is well-developed. He is not ill-appearing, toxic-appearing or diaphoretic.  HENT:     Head: Normocephalic and atraumatic.     Nose: No congestion or rhinorrhea.     Mouth/Throat:     Mouth: Mucous membranes are moist.     Pharynx: No oropharyngeal exudate or posterior oropharyngeal erythema.  Eyes:     Extraocular Movements: Extraocular movements intact.     Conjunctiva/sclera: Conjunctivae normal.     Pupils: Pupils are equal, round, and reactive to light.  Cardiovascular:     Rate and Rhythm: Normal rate and regular rhythm.     Heart sounds: No  murmur heard. Pulmonary:     Effort: Pulmonary effort is normal. No respiratory distress.     Breath sounds: Normal breath sounds. No wheezing, rhonchi or rales.  Chest:     Chest wall: No tenderness.  Abdominal:     General: Abdomen is flat.     Palpations: Abdomen is soft.     Tenderness: There is no abdominal tenderness. There is no right CVA tenderness, left CVA tenderness, guarding or rebound.  Musculoskeletal:        General: No swelling or tenderness.     Cervical back: Neck supple. No tenderness.  Skin:    General: Skin is warm and dry.     Capillary Refill: Capillary refill  takes less than 2 seconds.     Findings: No erythema.  Neurological:     General: No focal deficit present.     Mental Status: He is alert.  Psychiatric:        Mood and Affect: Mood normal.    ED Results / Procedures / Treatments   Labs (all labs ordered are listed, but only abnormal results are displayed) Labs Reviewed - No data to display  EKG None  Radiology No results found.  Procedures Procedures    Medications Ordered in ED Medications  albuterol (VENTOLIN HFA) 108 (90 Base) MCG/ACT inhaler 2 puff (has no administration in time range)    ED Course/ Medical Decision Making/ A&P                           Medical Decision Making Risk Prescription drug management.    Carrson ARTIN MCEUEN is a 56 y.o. male with a past medical history significant for GERD, hypertension, previous esophageal surgery, gastric bypass, and previous SVT who presents with URI symptoms and a positive COVID test at home.  Patient reports that over the weekend he had some nausea and diarrhea symptoms that have overall improved however he was told by his daughter that she was positive for COVID yesterday so he tested himself when he started having some URI symptoms.  He tested positive and then today has felt more congestion, cough, and fatigue.  He reports nausea still persistent but the diarrhea has improved.  No urinary complaints.  He currently denies any chest pain, palpitations, shortness of breath, just the cough and feeling malaise.  He wanted to make sure he did not need any other medications to help treat this.  On exam, lungs had some wheezing but chest was nontender.  He does report history of bronchitis with wheezing in the past but does not use inhaler for a long time.  He denies any chest pain or abdominal pain.  Normal bowel sounds.  No focal neurologic deficits.  Patient resting comfortably with reassuring vital signs without any hypoxia.  Clinically I suspect his COVID-19 is causing his  wheezing exacerbation and some of his symptoms with nausea.  We will do a p.o. challenge and anticipate giving him improvement for nausea medicine.  We will likely prescribe prescription for Tessalon and after discussion with him he would like the COVID medication.  We will write a prescription for this.  We will give the patient several possible butyryl to see if that helps his breathing from a wheezing standpoint and if he passes a p.o. challenge and appears better, and maintains reassuring vital signs, anticipate discharge home for outpatient follow-up.  He also reports he needs a work note for the next 5 days  which we will write.  Anticipate discharge after p.o. challenge and albuterol.  12:35 PM Wheezing improved after albuterol.  He is feeling well.  Vital signs remain reassuring.  No hypoxia.  Patient will be given prescriptions as discussed and will follow-up with a PCP.  He agreed with plan of care and return precautions.  No other questions or concerns and will be discharged.          Final Clinical Impression(s) / ED Diagnoses Final diagnoses:  COVID-19  Acute cough    Rx / DC Orders ED Discharge Orders          Ordered    molnupiravir EUA (LAGEVRIO) 200 mg CAPS capsule  2 times daily        03/24/21 1237    benzonatate (TESSALON) 100 MG capsule  3 times daily PRN        03/24/21 1237    ondansetron (ZOFRAN) 4 MG tablet  Every 8 hours PRN        03/24/21 1237            Clinical Impression: 1. COVID-19   2. Acute cough     Disposition: Discharge  Condition: Good  I have discussed the results, Dx and Tx plan with the pt(& family if present). He/she/they expressed understanding and agree(s) with the plan. Discharge instructions discussed at great length. Strict return precautions discussed and pt &/or family have verbalized understanding of the instructions. No further questions at time of discharge.    New Prescriptions   BENZONATATE (TESSALON) 100 MG  CAPSULE    Take 1 capsule (100 mg total) by mouth 3 (three) times daily as needed for cough.   MOLNUPIRAVIR EUA (LAGEVRIO) 200 MG CAPS CAPSULE    Take 4 capsules (800 mg total) by mouth 2 (two) times daily for 5 days.   ONDANSETRON (ZOFRAN) 4 MG TABLET    Take 1 tablet (4 mg total) by mouth every 8 (eight) hours as needed for nausea or vomiting.    Follow Up: Biagio Borg, MD [place] [address] [phone]     [facility] [place] [id number] [phone]        Timeka Goette, Gwenyth Allegra, MD 03/24/21 1239

## 2021-03-24 NOTE — ED Notes (Signed)
Patient educated on the proper use of MDI. Patient had good effort and understanding.

## 2021-03-24 NOTE — ED Notes (Signed)
Pt given urinal.

## 2021-03-24 NOTE — Discharge Instructions (Signed)
Your history, exam, work-up today are consistent with COVID-19 causing your symptoms.  Please use the COVID medication as we discussed as well as the nausea medicine and cough medicine to help with symptoms.  Please rest and stay hydrated.  Please use the albuterol if you get any shortness of breath or worsened wheezing again.  Please follow-up with your primary doctor.  If any symptoms change or worsen acutely, please return to the nearest emergency department.

## 2021-03-24 NOTE — ED Triage Notes (Signed)
Tested positive   for covid last night after having s/s cough / h/a and sore throat and congestion  took tylenol this am

## 2021-12-02 ENCOUNTER — Other Ambulatory Visit: Payer: Self-pay | Admitting: Internal Medicine

## 2021-12-02 NOTE — Telephone Encounter (Signed)
Please refill as per office routine med refill policy (all routine meds to be refilled for 3 mo or monthly (per pt preference) up to one year from last visit, then month to month grace period for 3 mo, then further med refills will have to be denied) ? ?

## 2022-01-23 ENCOUNTER — Other Ambulatory Visit: Payer: Self-pay | Admitting: Internal Medicine

## 2022-01-23 NOTE — Telephone Encounter (Signed)
Please refill as per office routine med refill policy (all routine meds to be refilled for 3 mo or monthly (per pt preference) up to one year from last visit, then month to month grace period for 3 mo, then further med refills will have to be denied) ? ?

## 2023-11-04 ENCOUNTER — Emergency Department (HOSPITAL_BASED_OUTPATIENT_CLINIC_OR_DEPARTMENT_OTHER)
Admission: EM | Admit: 2023-11-04 | Discharge: 2023-11-04 | Disposition: A | Discharge status: Home / Self Care | Payer: Self-pay | Attending: Emergency Medicine | Admitting: Emergency Medicine

## 2023-11-04 DIAGNOSIS — F17200 Nicotine dependence, unspecified, uncomplicated: Secondary | ICD-10-CM | POA: Insufficient documentation

## 2023-11-04 DIAGNOSIS — J040 Acute laryngitis: Secondary | ICD-10-CM | POA: Insufficient documentation

## 2023-11-04 LAB — RESP PANEL BY RT-PCR (RSV, FLU A&B, COVID)  RVPGX2
Influenza A by PCR: NEGATIVE
Influenza B by PCR: NEGATIVE
Resp Syncytial Virus by PCR: NEGATIVE
SARS Coronavirus 2 by RT PCR: NEGATIVE

## 2023-11-04 MED ORDER — METHYLPREDNISOLONE 4 MG PO TBPK: ORAL_TABLET | ORAL | 0 refills | Status: DC

## 2023-11-04 NOTE — ED Provider Notes (Signed)
 [facility] EMERGENCY DEPARTMENT AT [facility] Provider Note   CSN: [id number] Arrival date & time: 11/04/23  9149     Patient presents with: [facility] Chris Hunt is a 58 y.o. male.   HPI   58 year old male presenting to the emergency department with a hoarse voice for the past 5 days.  The patient denies any history of the same.  He does endorse a smoking history.  He denies any weight loss, difficulty swallowing.  He denies any sensation of swelling in his throat, difficulty with range of motion of his neck.  He denies any fevers or chills.  He denies any cough, other upper respiratory symptoms.  Prior to Admission medications   Medication Sig Start Date End Date Taking? Authorizing Provider  methylPREDNISolone  (MEDROL  DOSEPAK) 4 MG TBPK tablet Take as directed on the box 11/04/23  Yes Jerrol Agent, MD  benzonatate  (TESSALON ) 100 MG capsule Take 1 capsule (100 mg total) by mouth 3 (three) times daily as needed for cough. 03/24/21   Tegeler, Lonni PARAS, MD  fluticasone  (FLONASE ) 50 MCG/ACT nasal spray Place 1 spray into both nostrils daily for 7 days. 02/07/21 02/14/21  Elnor Savant A, DO  guaiFENesin -codeine  100-10 MG/5ML syrup Take 5 mLs by mouth 2 (two) times daily as needed for cough. 02/07/21   Elnor Savant LABOR, DO  metoprolol  tartrate (LOPRESSOR ) 50 MG tablet TAKE 1 TABLET BY MOUTH TWICE DAILY 12/02/21   Norleen Agent ORN, MD  ondansetron  (ZOFRAN ) 4 MG tablet Take 1 tablet (4 mg total) by mouth every 8 (eight) hours as needed for nausea or vomiting. 03/24/21   Tegeler, Lonni PARAS, MD  pseudoephedrine  (SUDAFED) 30 MG tablet Take 1 tablet (30 mg total) by mouth every 8 (eight) hours as needed for congestion. 02/07/21   Elnor Savant LABOR, DO    Allergies: Shellfish allergy and Lactose intolerance (gi)    Review of Systems  All other systems reviewed and are negative.   Updated Vital Signs BP (!) 151/94 (BP Location: Right Arm)   Pulse 68   Temp 98.7 F (37.1 C)  (Oral)   Resp 18   SpO2 99%   Physical Exam Vitals and nursing note reviewed.  Constitutional:      General: He is not in acute distress. HENT:     Head: Normocephalic and atraumatic.     Mouth/Throat:     Comments: No erythema, no stridor or trismus, no discharge Eyes:     Conjunctiva/sclera: Conjunctivae normal.     Pupils: Pupils are equal, round, and reactive to light.  Neck:     Comments: Active range of motion of the neck without pain, no lymphadenopathy Cardiovascular:     Rate and Rhythm: Normal rate and regular rhythm.  Pulmonary:     Effort: Pulmonary effort is normal. No respiratory distress.     Breath sounds: No stridor.  Abdominal:     General: There is no distension.     Tenderness: There is no guarding.  Musculoskeletal:        General: No deformity or signs of injury.     Cervical back: Neck supple.  Skin:    Findings: No lesion or rash.  Neurological:     General: No focal deficit present.     Mental Status: He is alert. Mental status is at baseline.     (all labs ordered are listed, but only abnormal results are displayed) Labs Reviewed  RESP PANEL BY RT-PCR (RSV, FLU A&B, COVID)  [id number]  EKG: None  Radiology: No results found.   Procedures   Medications Ordered in the ED - No data to display                                  Medical Decision Making   58 year old male presenting to the emergency department with a hoarse voice for the past 5 days.  The patient denies any history of the same.  He does endorse a smoking history.  He denies any weight loss, difficulty swallowing.  He denies any sensation of swelling in his throat, difficulty with range of motion of his neck.  He denies any fevers or chills.  He denies any cough, other upper respiratory symptoms.  On arrival, the patient was afebrile, not tachycardic or tachypneic, hemodynamically stable.  Patient on exam has no stridor, no trismus, no lymphadenopathy, intact range of motion  of the neck.  He is afebrile.  He has had no weight loss, reports a mild weight gain.  Presenting with symptoms of laryngitis for the past 5 days, afebrile, low concern for but considered vocal cord dysfunction, vocal cord polyp, less likely mass/malignancy, less likely an bacterial infectious etiology.  Will treat symptomatically with a Medrol  dose pack, provided referral for ENT follow-up in the event that symptoms persist, return precautions provided.     Final diagnoses:  Laryngitis    ED Discharge Orders          Ordered    methylPREDNISolone  (MEDROL  DOSEPAK) 4 MG TBPK tablet        11/04/23 0933    Ambulatory referral to ENT        11/04/23 0933               Jerrol Agent, MD 11/04/23 [phone]

## 2023-11-04 NOTE — ED Triage Notes (Signed)
 C/o hoarseness x 5 days. Denies fever.

## 2023-11-04 NOTE — Discharge Instructions (Addendum)
 Take a steroid pack, push plenty of fluids, Tylenol  ibuprofen  for any discomfort, return for any severe worsening symptoms to include difficulty swallowing, pain with range of motion of the neck, development of fever, significant weight loss.  Follow-up with ENT if your symptoms persist as this could be an indication for visualization of your vocal cords via laryngoscopy.  Most likely your symptoms are caused by a viral infection which is self-limiting

## 2023-12-06 ENCOUNTER — Telehealth: Payer: Self-pay | Admitting: Nurse Practitioner

## 2023-12-06 DIAGNOSIS — J069 Acute upper respiratory infection, unspecified: Secondary | ICD-10-CM

## 2023-12-06 DIAGNOSIS — T7840XA Allergy, unspecified, initial encounter: Secondary | ICD-10-CM

## 2023-12-06 DIAGNOSIS — R051 Acute cough: Secondary | ICD-10-CM

## 2023-12-06 MED ORDER — FLUTICASONE PROPIONATE 50 MCG/ACT NA SUSP: 2.0000 | Freq: Every day | NASAL | 6 refills | Status: AC

## 2023-12-06 MED ORDER — PROMETHAZINE-DM 6.25-15 MG/5ML PO SYRP: 5.0000 mL | ORAL_SOLUTION | Freq: Four times a day (QID) | ORAL | 0 refills | Status: DC | PRN

## 2023-12-06 MED ORDER — LORATADINE 10 MG PO TABS: 10.0000 mg | ORAL_TABLET | Freq: Every day | ORAL | 11 refills | Status: AC

## 2023-12-06 MED ORDER — DIPHENHYDRAMINE HCL 50 MG PO TABS: 50.0000 mg | ORAL_TABLET | Freq: Every evening | ORAL | 0 refills | Status: DC | PRN

## 2023-12-06 NOTE — Progress Notes (Signed)
 Acute Video Visit    Virtual Visit Consent:   THEODUS RAN, you are scheduled for a virtual visit with a [facility] provider today.     Just as with appointments in the office, your consent must be obtained to participate.  Your consent will be active for this visit and any virtual visit you may have with one of our providers in the next 365 days.     If you have a MyChart account, a copy of this consent can be sent to you electronically.  All virtual visits are billed to your insurance company just like a traditional visit in the office.    If the connection with a video visit is poor, the visit may have to be switched to a telephone visit.  With either a video or telephone visit, we are not always able to ensure that we have a secure connection.     I need to obtain your verbal consent now.   Are you willing to proceed with your visit today?    Chris Hunt has provided verbal consent on 12/06/2023 for a virtual visit (video or telephone).   Lauraine Kitty, FNP  Date: 12/06/2023 12:25 PM   Subjective:     Patient ID: Chris Hunt, male    DOB: Apr 29, 1965, 58 y.o.   MRN: [id number]  LILLETTE Lauraine Kitty, connected with  URA HAUSEN  ([id number], 1966/02/16) on 12/06/23 at  1:15 PM EDT by a video-enabled telemedicine application and verified that I am speaking with the correct person using two identifiers.   Location: Patient: [facility]  Provider: Virtual Visit Location Provider: Home Office   I discussed the limitations of evaluation and management by telemedicine and the availability of in person appointments. The patient expressed understanding and agreed to proceed.    HPI   Chris Hunt is a 58 y.o. who identifies as a male who was assigned male at birth, and is being seen today with complaints of productive cough   He has had a tickle cough for a week now, he was seen in the ED 11/04/23 for laryngitis and says he recovered from that and then got a cough a week ago  that seemed to be irritated from the environment. He does have a significant history of allergies. Says flonase  has worked well for him in the past   Denies wheezing/fever or chills  Denies a history of asthma  He does use tobacco products/smokes cigarettes   He is not taking any prescription medication now  He is using some over the counter supplements for HTN and testosterone   He is not excited about prescription medication as he feels that messes up other things  History is significant for SVT/HTN/allergies/GERD/left radiculopathy    Appears from record that his last refills from Internal Medicine were from 2023.  He was recently seen in the ED for laryngitis (11/04/2023)    Review of Systems  Constitutional:  Negative for chills, fever and weight loss.  HENT: Negative.    Respiratory:  Positive for cough. Negative for hemoptysis, shortness of breath and wheezing.   Cardiovascular: Negative.   Skin: Negative.         Objective:      Physical Exam Constitutional:      General: He is not in acute distress.    Appearance: Normal appearance. He is not ill-appearing.  HENT:     Nose: No rhinorrhea.     Mouth/Throat:     Mouth: Mucous membranes  are moist.  Pulmonary:     Effort: Pulmonary effort is normal.  Neurological:     Mental Status: He is alert.          Assessment & Plan:   1. Allergy, initial encounter (Primary)  - fluticasone  (FLONASE ) 50 MCG/ACT nasal spray; Place 2 sprays into both nostrils daily.  Dispense: 16 g; Refill: 6 - loratadine (CLARITIN) 10 MG tablet; Take 1 tablet (10 mg total) by mouth daily.  Dispense: 30 tablet; Refill: 11 - diphenhydrAMINE  (BENADRYL ) 50 MG tablet; Take 1 tablet (50 mg total) by mouth at bedtime as needed for itching.  Dispense: 30 tablet; Refill: 0  2. Acute cough  - promethazine-dextromethorphan (PROMETHAZINE-DM) 6.25-15 MG/5ML syrup; Take 5 mLs by mouth 4 (four) times daily as needed.  Dispense: 118 mL; Refill:  0  3. Viral upper respiratory tract infection  - promethazine-dextromethorphan (PROMETHAZINE-DM) 6.25-15 MG/5ML syrup; Take 5 mLs by mouth 4 (four) times daily as needed.  Dispense: 118 mL; Refill: 0   Follow Up Instructions: I discussed the assessment and treatment plan with the patient. The patient was provided an opportunity to ask questions and all were answered. The patient agreed with the plan and demonstrated an understanding of the instructions.  A copy of instructions were sent to the patient via MyChart unless otherwise noted below.    The patient was advised to call back or seek an in-person evaluation if the symptoms worsen or if the condition fails to improve as anticipated.    Lauraine Kitty, FNP  **Disclaimer: This note may have been dictated with voice recognition software. Similar sounding words can inadvertently be transcribed and this note may contain transcription errors which may not have been corrected upon publication of note.**

## 2023-12-07 ENCOUNTER — Emergency Department (HOSPITAL_BASED_OUTPATIENT_CLINIC_OR_DEPARTMENT_OTHER): Payer: Self-pay

## 2023-12-07 ENCOUNTER — Other Ambulatory Visit: Payer: Self-pay

## 2023-12-07 ENCOUNTER — Emergency Department (HOSPITAL_BASED_OUTPATIENT_CLINIC_OR_DEPARTMENT_OTHER)
Admission: EM | Admit: 2023-12-07 | Discharge: 2023-12-07 | Disposition: A | Discharge status: Home / Self Care | Payer: Self-pay | Attending: Emergency Medicine | Admitting: Emergency Medicine

## 2023-12-07 DIAGNOSIS — M79662 Pain in left lower leg: Secondary | ICD-10-CM | POA: Insufficient documentation

## 2023-12-07 DIAGNOSIS — B349 Viral infection, unspecified: Secondary | ICD-10-CM

## 2023-12-07 DIAGNOSIS — M79661 Pain in right lower leg: Secondary | ICD-10-CM | POA: Insufficient documentation

## 2023-12-07 DIAGNOSIS — F17200 Nicotine dependence, unspecified, uncomplicated: Secondary | ICD-10-CM | POA: Insufficient documentation

## 2023-12-07 DIAGNOSIS — M791 Myalgia, unspecified site: Secondary | ICD-10-CM

## 2023-12-07 LAB — BASIC METABOLIC PANEL WITH GFR
Anion gap: 13 (ref 5–15)
BUN: 11 mg/dL (ref 6–20)
CO2: 22 mmol/L (ref 22–32)
Calcium: 9.9 mg/dL (ref 8.9–10.3)
Chloride: 102 mmol/L (ref 98–111)
Creatinine, Ser: 0.95 mg/dL (ref 0.61–1.24)
GFR, Estimated: >60 mL/min (ref >60)
Glucose, Bld: 105 mg/dL — ABNORMAL HIGH (ref 70–99)
Potassium: 4.1 mmol/L (ref 3.5–5.1)
Sodium: 137 mmol/L (ref 135–145)

## 2023-12-07 LAB — CBC WITH DIFFERENTIAL/PLATELET
Abs Immature Granulocytes: 0.01 K/uL (ref 0.00–0.07)
Basophils Absolute: 0 K/uL (ref 0.0–0.1)
Basophils Relative: 1 %
Eosinophils Absolute: 0.3 K/uL (ref 0.0–0.5)
Eosinophils Relative: 4 %
HCT: 43.7 % (ref 39.0–52.0)
Hemoglobin: 14.7 g/dL (ref 13.0–17.0)
Immature Granulocytes: 0 %
Lymphocytes Relative: 15 %
Lymphs Abs: 1.1 K/uL (ref 0.7–4.0)
MCH: 28.8 pg (ref 26.0–34.0)
MCHC: 33.6 g/dL (ref 30.0–36.0)
MCV: 85.5 fL (ref 80.0–100.0)
Monocytes Absolute: 0.6 K/uL (ref 0.1–1.0)
Monocytes Relative: 8 %
Neutro Abs: 5.5 K/uL (ref 1.7–7.7)
Neutrophils Relative %: 72 %
Platelets: 216 K/uL (ref 150–400)
RBC: 5.11 MIL/uL (ref 4.22–5.81)
RDW: 16 % — ABNORMAL HIGH (ref 11.5–15.5)
WBC: 7.5 K/uL (ref 4.0–10.5)
nRBC: 0 % (ref 0.0–0.2)

## 2023-12-07 LAB — RESP PANEL BY RT-PCR (RSV, FLU A&B, COVID)  RVPGX2
Influenza A by PCR: NEGATIVE
Influenza B by PCR: NEGATIVE
Resp Syncytial Virus by PCR: NEGATIVE
SARS Coronavirus 2 by RT PCR: NEGATIVE

## 2023-12-07 LAB — CK: Total CK: 176 U/L (ref 49–397)

## 2023-12-07 LAB — MAGNESIUM: Magnesium: 2.4 mg/dL (ref 1.7–2.4)

## 2023-12-07 MED ORDER — KETOROLAC TROMETHAMINE 15 MG/ML IJ SOLN
15.0000 mg | Freq: Once | INTRAMUSCULAR | Status: AC
  Administered 2023-12-07: 15 mg via INTRAVENOUS
  Filled 2023-12-07: qty 1

## 2023-12-07 MED ORDER — SODIUM CHLORIDE 0.9 % IV BOLUS
1000.0000 mL | Freq: Once | INTRAVENOUS | Status: AC
  Administered 2023-12-07: 1000 mL via INTRAVENOUS

## 2023-12-07 MED ORDER — ACETAMINOPHEN 500 MG PO TABS
1000.0000 mg | ORAL_TABLET | Freq: Once | ORAL | Status: AC
  Administered 2023-12-07: 1000 mg via ORAL
  Filled 2023-12-07: qty 2

## 2023-12-07 MED ORDER — CYCLOBENZAPRINE HCL 5 MG PO TABS
5.0000 mg | ORAL_TABLET | Freq: Once | ORAL | Status: AC
  Administered 2023-12-07: 5 mg via ORAL
  Filled 2023-12-07: qty 1

## 2023-12-07 NOTE — Discharge Instructions (Addendum)
 You are likely having cramping of her legs due to your illness or frequent walking.  This should improve with time.  Please make sure you are hydrating well, drinking lots of water will help support your body.  Please take Tylenol  (acetaminophen ) and ibuprofen  as needed for the pain.  You can alternate these medicines so that you are taking 1 every 3 hours, but each medicine has to be 6 hours apart from the last dose of the same medicine.  Do not take ibuprofen  for longer than 1 to 2 weeks as this can wear down your stomach lining and make you bleed.  Please return to the ED if you become short of breath, experience sharp chest pain, pain in your legs gets worse, or you notice swelling or redness of your legs.

## 2023-12-07 NOTE — ED Provider Notes (Signed)
 [place] EMERGENCY DEPARTMENT AT [facility] Provider Note   CSN: [id number] Arrival date & time: 12/07/23  9171    Patient presents with: No chief complaint on file.  Cordae GRAYCEN SADLON is a 58 y.o. male.   Trig Lerette is a 58 yo male with PMH  This AM, patient felt fine in bed.  Stood up out of bed and immediately felt sharp pain in his right tight. Reportedly, he does intermittently get similar leg cramps. Normally walks 5-10 miles daily. No recent travel or sedentary period. Does smoke actively. Patient reports that he may have had a blood clot in his legs years ago, but he is not sure. No chest pain  Notes he has had a cough and runny nose starting about 3-4 days.  Denies subjective fever recently. 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      Prior to Admission medications   Medication Sig Start Date End Date Taking? Authorizing Provider  benzonatate  (TESSALON ) 100 MG capsule Take 1 capsule (100 mg total) by mouth 3 (three) times daily as needed for cough. 03/24/21   Tegeler, Lonni PARAS, MD  diphenhydrAMINE  (BENADRYL ) 50 MG tablet Take 1 tablet (50 mg total) by mouth at bedtime as needed for itching. 12/06/23   Kennyth Domino, FNP  fluticasone  (FLONASE ) 50 MCG/ACT nasal spray Place 1 spray into both nostrils daily for 7 days. 02/07/21 02/14/21  Elnor Savant A, DO  fluticasone  (FLONASE ) 50 MCG/ACT nasal spray Place 2 sprays into both nostrils daily. 12/06/23   Kennyth Domino, FNP  guaiFENesin -codeine  100-10 MG/5ML syrup Take 5 mLs by mouth 2 (two) times daily as needed for cough. 02/07/21   Elnor Savant LABOR, DO  loratadine (CLARITIN) 10 MG tablet Take 1 tablet (10 mg total) by mouth daily. 12/06/23   Kennyth Domino, FNP  methylPREDNISolone  (MEDROL  DOSEPAK) 4 MG TBPK tablet Take as directed on the box 11/04/23   Jerrol Agent, MD  metoprolol  tartrate (LOPRESSOR ) 50 MG tablet TAKE 1 TABLET BY MOUTH TWICE DAILY 12/02/21   Norleen Agent ORN, MD   ondansetron  (ZOFRAN ) 4 MG tablet Take 1 tablet (4 mg total) by mouth every 8 (eight) hours as needed for nausea or vomiting. 03/24/21   Tegeler, Lonni PARAS, MD  promethazine-dextromethorphan (PROMETHAZINE-DM) 6.25-15 MG/5ML syrup Take 5 mLs by mouth 4 (four) times daily as needed. 12/06/23   Kennyth Domino, FNP  pseudoephedrine  (SUDAFED) 30 MG tablet Take 1 tablet (30 mg total) by mouth every 8 (eight) hours as needed for congestion. 02/07/21   Elnor Savant LABOR, DO    Allergies: Shellfish allergy and Lactose intolerance (gi)    Review of Systems  Updated Vital Signs BP (!) 154/101 (BP Location: Right Arm)   Pulse 96   Resp 17   SpO2 97%   Physical Exam Vitals and nursing note reviewed.  Constitutional:      General: He is not in acute distress.    Appearance: He is well-developed.  HENT:     Head: Normocephalic and atraumatic.  Eyes:     Conjunctiva/sclera: Conjunctivae normal.  Cardiovascular:     Rate and Rhythm: Normal rate and regular rhythm.     Pulses:          Dorsalis pedis pulses are 2+ on the right side and 2+ on the left side.       Posterior tibial pulses are 2+ on the right side and 2+ on the left side.     Heart  sounds: No murmur heard. Pulmonary:     Effort: Pulmonary effort is normal. No respiratory distress.     Breath sounds: Normal breath sounds.  Abdominal:     Palpations: Abdomen is soft.     Tenderness: There is no abdominal tenderness.  Musculoskeletal:        General: No swelling.     Cervical back: Neck supple.     Right lower leg: Tenderness (generalized tenderness to palpation, greatest at thight) present. No swelling or deformity. No edema.     Left lower leg: Tenderness (generalized TTP, greatest at calf) present. No swelling or deformity. No edema.  Feet:     Right foot:     Skin integrity: No ulcer.     Left foot:     Skin integrity: No ulcer.  Skin:    General: Skin is warm and dry.     Capillary Refill: Capillary refill takes less than  2 seconds.  Neurological:     Mental Status: He is alert.  Psychiatric:        Mood and Affect: Mood normal.     (all labs ordered are listed, but only abnormal results are displayed) Labs Reviewed - No data to display  EKG: None  Radiology: No results found.   Procedures   Medications Ordered in the ED - No data to display                                  Medical Decision Making Kiandre Spagnolo is a 58 yo male with PMH of tobacco use disorder presenting with bilateral LE pain and cramping.  Initially considered DVT, rhabdomyolysis, electrolyte abnormality such as hypokalemia, viral myalgias, and MSK etiology such as muscle strain.  Low concern for claudication given that the patient ambulates approximately 5 miles daily without difficulty at baseline.  Patient does smoke cigarettes, which increases her risk for DVT but has not had any recent travel or long centering periods.  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  Patient has no chest pain, dypnea, or hypoxia on monitor, no concern for PE.  Reassuringly, patient is distal lower extremity pulses were strong and he was neurovascularly intact throughout his lower extremities.  K on BMP and magnesium WNL.  CK WNL, no concern for rhabdo.  Quad RVP negative.  Bilateral DVT ultrasound without evidence of clot.  Pain treated with Flexeril  and acetaminophen , then Toradol .  Patient noted improvement of symptoms after treatment.  Suspect myalgias in setting of upper respiratory illness.  Discharged home with instructions for supportive care and hydration, return precautions previous.  Amount and/or Complexity of Data Reviewed Labs: ordered.  Risk OTC drugs. Prescription drug management.      Final diagnoses:  None    ED Discharge Orders     None        Tim Corriher, MD 12/07/23 1102    Ruthe Cornet, DO 12/07/23 1151

## 2023-12-07 NOTE — ED Provider Notes (Signed)
 Supervised resident visit.  Patient here with bilateral pain and cramping.  History of DVT in the past.  Works as a [employer] but has not drove in about a month.  Having pain cramping in both legs little bit worse on the right.  Does have some cough and runny nose and some viral symptoms as well.  Denies any chest pain shortness of breath weakness numbness tingling.  Describes it more as a cramping pain.  Is good good pulses on exam.  There is no obvious swelling to the legs.  He looks pretty comfortable otherwise.  He said a little bit of nasal congestion.  Differential diagnosis could be some sort of viral myositis/muscle spasm/less likely DVT.  Will check basic labs including CK and DVT studies.  Will give IV fluids and reevaluate.  I have no concern for pneumonia ACS PE or other acute process otherwise.  Please see my resident note for further results evaluation and disposition of the patient.  This chart was dictated using voice recognition software.  Despite best efforts to proofread,  errors can occur which can change the documentation meaning.    Ruthe Cornet, DO 12/07/23 [phone]

## 2023-12-07 NOTE — ED Triage Notes (Signed)
 Patient states sudden onset right leg cramping since 7:30 this morning. No meds prior to arrival.

## 2023-12-13 ENCOUNTER — Emergency Department (HOSPITAL_BASED_OUTPATIENT_CLINIC_OR_DEPARTMENT_OTHER): Payer: Self-pay | Admitting: Radiology

## 2023-12-13 ENCOUNTER — Other Ambulatory Visit: Payer: Self-pay

## 2023-12-13 ENCOUNTER — Emergency Department (HOSPITAL_BASED_OUTPATIENT_CLINIC_OR_DEPARTMENT_OTHER)
Admission: EM | Admit: 2023-12-13 | Discharge: 2023-12-13 | Disposition: A | Discharge status: Home / Self Care | Payer: Self-pay | Attending: Emergency Medicine | Admitting: Emergency Medicine

## 2023-12-13 DIAGNOSIS — Z79899 Other long term (current) drug therapy: Secondary | ICD-10-CM | POA: Insufficient documentation

## 2023-12-13 DIAGNOSIS — R059 Cough, unspecified: Secondary | ICD-10-CM | POA: Insufficient documentation

## 2023-12-13 DIAGNOSIS — I10 Essential (primary) hypertension: Secondary | ICD-10-CM | POA: Insufficient documentation

## 2023-12-13 DIAGNOSIS — R072 Precordial pain: Secondary | ICD-10-CM | POA: Insufficient documentation

## 2023-12-13 DIAGNOSIS — R0602 Shortness of breath: Secondary | ICD-10-CM | POA: Insufficient documentation

## 2023-12-13 DIAGNOSIS — I2510 Atherosclerotic heart disease of native coronary artery without angina pectoris: Secondary | ICD-10-CM | POA: Insufficient documentation

## 2023-12-13 LAB — RESP PANEL BY RT-PCR (RSV, FLU A&B, COVID)  RVPGX2
Influenza A by PCR: NEGATIVE
Influenza B by PCR: NEGATIVE
Resp Syncytial Virus by PCR: NEGATIVE
SARS Coronavirus 2 by RT PCR: NEGATIVE

## 2023-12-13 LAB — TROPONIN T, HIGH SENSITIVITY
Troponin T High Sensitivity: <15 ng/L (ref 0–19)
Troponin T High Sensitivity: <15 ng/L (ref 0–19)

## 2023-12-13 LAB — CBC
HCT: 43.1 % (ref 39.0–52.0)
Hemoglobin: 14.4 g/dL (ref 13.0–17.0)
MCH: 28.6 pg (ref 26.0–34.0)
MCHC: 33.4 g/dL (ref 30.0–36.0)
MCV: 85.5 fL (ref 80.0–100.0)
Platelets: 201 K/uL (ref 150–400)
RBC: 5.04 MIL/uL (ref 4.22–5.81)
RDW: 15.9 % — ABNORMAL HIGH (ref 11.5–15.5)
WBC: 6.4 K/uL (ref 4.0–10.5)
nRBC: 0 % (ref 0.0–0.2)

## 2023-12-13 LAB — BASIC METABOLIC PANEL WITH GFR
Anion gap: 10 (ref 5–15)
BUN: 13 mg/dL (ref 6–20)
CO2: 25 mmol/L (ref 22–32)
Calcium: 9.5 mg/dL (ref 8.9–10.3)
Chloride: 105 mmol/L (ref 98–111)
Creatinine, Ser: 1.26 mg/dL — ABNORMAL HIGH (ref 0.61–1.24)
GFR, Estimated: >60 mL/min (ref >60)
Glucose, Bld: 107 mg/dL — ABNORMAL HIGH (ref 70–99)
Potassium: 4 mmol/L (ref 3.5–5.1)
Sodium: 140 mmol/L (ref 135–145)

## 2023-12-13 MED ORDER — IBUPROFEN 400 MG PO TABS
400.0000 mg | ORAL_TABLET | Freq: Once | ORAL | Status: AC
  Administered 2023-12-13: 400 mg via ORAL
  Filled 2023-12-13: qty 1

## 2023-12-13 MED ORDER — ACETAMINOPHEN 500 MG PO TABS
1000.0000 mg | ORAL_TABLET | Freq: Once | ORAL | Status: AC
  Administered 2023-12-13: 1000 mg via ORAL
  Filled 2023-12-13: qty 2

## 2023-12-13 NOTE — ED Provider Notes (Signed)
 [place] EMERGENCY DEPARTMENT AT [facility] Provider Note   CSN: [id number] Arrival date & time: 12/13/23  0016     Patient presents with: Chest Pain   Chris Hunt is a 58 y.o. male.   HPI     This is a 58 year old male who presents with chest pain and cough.  Patient reports several hour history of chest discomfort and cough.  Reports shortness of breath.  No fevers.  To get his flu shot last week.  States that the symptoms came on after walking outside.  Denies leg swelling or history of blood clots.  Reports history of prior coronary artery disease and high blood pressure.  Prior to Admission medications   Medication Sig Start Date End Date Taking? Authorizing Provider  benzonatate  (TESSALON ) 100 MG capsule Take 1 capsule (100 mg total) by mouth 3 (three) times daily as needed for cough. 03/24/21   Tegeler, Lonni PARAS, MD  diphenhydrAMINE  (BENADRYL ) 50 MG tablet Take 1 tablet (50 mg total) by mouth at bedtime as needed for itching. 12/06/23   Kennyth Domino, FNP  fluticasone  (FLONASE ) 50 MCG/ACT nasal spray Place 1 spray into both nostrils daily for 7 days. 02/07/21 02/14/21  Elnor Savant A, DO  fluticasone  (FLONASE ) 50 MCG/ACT nasal spray Place 2 sprays into both nostrils daily. 12/06/23   Kennyth Domino, FNP  guaiFENesin -codeine  100-10 MG/5ML syrup Take 5 mLs by mouth 2 (two) times daily as needed for cough. 02/07/21   Elnor Savant LABOR, DO  loratadine (CLARITIN) 10 MG tablet Take 1 tablet (10 mg total) by mouth daily. 12/06/23   Kennyth Domino, FNP  methylPREDNISolone  (MEDROL  DOSEPAK) 4 MG TBPK tablet Take as directed on the box 11/04/23   Jerrol Agent, MD  metoprolol  tartrate (LOPRESSOR ) 50 MG tablet TAKE 1 TABLET BY MOUTH TWICE DAILY 12/02/21   Norleen Agent ORN, MD  ondansetron  (ZOFRAN ) 4 MG tablet Take 1 tablet (4 mg total) by mouth every 8 (eight) hours as needed for nausea or vomiting. 03/24/21   Tegeler, Lonni PARAS, MD  promethazine-dextromethorphan  (PROMETHAZINE-DM) 6.25-15 MG/5ML syrup Take 5 mLs by mouth 4 (four) times daily as needed. 12/06/23   Kennyth Domino, FNP  pseudoephedrine  (SUDAFED) 30 MG tablet Take 1 tablet (30 mg total) by mouth every 8 (eight) hours as needed for congestion. 02/07/21   Elnor Savant LABOR, DO    Allergies: Shellfish allergy and Lactose intolerance (gi)    Review of Systems  Constitutional:  Negative for fever.  Respiratory:  Positive for cough and shortness of breath.   Cardiovascular:  Positive for chest pain.  Gastrointestinal:  Negative for abdominal pain.  All other systems reviewed and are negative.   Updated Vital Signs BP 121/82   Pulse (!) 52   Resp 19   Ht 1.854 m (6' 1)   Wt 98.4 kg   SpO2 98%   BMI 28.63 kg/m   Physical Exam Vitals and nursing note reviewed.  Constitutional:      Appearance: He is well-developed. He is not ill-appearing.  HENT:     Head: Normocephalic and atraumatic.  Eyes:     Pupils: Pupils are equal, round, and reactive to light.  Cardiovascular:     Rate and Rhythm: Normal rate and regular rhythm.     Heart sounds: Normal heart sounds. No murmur heard. Pulmonary:     Effort: Pulmonary effort is normal. No respiratory distress.     Breath sounds: Normal breath sounds. No wheezing.  Abdominal:     General:  Bowel sounds are normal.     Palpations: Abdomen is soft.     Tenderness: There is no abdominal tenderness. There is no rebound.  Musculoskeletal:     Cervical back: Neck supple.  Lymphadenopathy:     Cervical: No cervical adenopathy.  Skin:    General: Skin is warm and dry.  Neurological:     Mental Status: He is alert and oriented to person, place, and time.  Psychiatric:        Mood and Affect: Mood normal.     (all labs ordered are listed, but only abnormal results are displayed) Labs Reviewed  BASIC METABOLIC PANEL WITH GFR - Abnormal; Notable for the following components:      Result Value   Glucose, Bld 107 (*)    Creatinine, Ser 1.26  (*)    All other components within normal limits  CBC - Abnormal; Notable for the following components:   RDW 15.9 (*)    All other components within normal limits  RESP PANEL BY RT-PCR (RSV, FLU A&B, COVID)  RVPGX2  TROPONIN T, HIGH SENSITIVITY  TROPONIN T, HIGH SENSITIVITY    EKG: EKG Interpretation Date/Time:  Wednesday December 13 2023 00:42:11 EDT Ventricular Rate:  62 PR Interval:  150 QRS Duration:  102 QT Interval:  426 QTC Calculation: 432 R Axis:   0  Text Interpretation: Normal sinus rhythm with sinus arrhythmia Minimal voltage criteria for LVH, may be normal variant ( Sokolow-Lyon ) Borderline ECG When compared with ECG of 28-Jan-2016 15:27, PREVIOUS ECG IS PRESENT Confirmed by Bari Pfeiffer ([id number]) on 12/13/2023 4:04:42 AM  Radiology: [facility] Chest 2 View Result Date: 12/13/2023 EXAM: 2 VIEW(S) XRAY OF THE CHEST 12/13/2023 01:02:00 AM COMPARISON: None available. CLINICAL HISTORY: chest pain. Pt POV reporting chest past few hours after walk outside. Denies SOB. FINDINGS: LUNGS AND PLEURA: No focal pulmonary opacity. No pulmonary edema. No pleural effusion. No pneumothorax. HEART AND MEDIASTINUM: No acute abnormality of the cardiac and mediastinal silhouettes. BONES AND SOFT TISSUES: Cervical fusion hardware noted. IMPRESSION: 1. No acute cardiopulmonary process. Electronically signed by: Pinkie Pebbles MD 12/13/2023 01:04 AM EDT RP Workstation: [id number]     Procedures   Medications Ordered in the ED  acetaminophen  (TYLENOL ) tablet 1,000 mg (1,000 mg Oral Given 12/13/23 0325)  ibuprofen  (ADVIL ) tablet 400 mg (400 mg Oral Given 12/13/23 0431)                                    Medical Decision Making Amount and/or Complexity of Data Reviewed Labs: ordered. Radiology: ordered.  Risk OTC drugs. Prescription drug management.   This patient presents to the ED for concern of chest pain, this involves an extensive number of treatment options, and is a complaint  that carries with it a high risk of complications and morbidity.  I considered the following differential and admission for this acute, potentially life threatening condition.  The differential diagnosis includes ACS, PE, pneumothorax, pneumonia, viral illness  MDM:    This is a 58 year old male who presents with chest pain.  He is nontoxic and vital signs are reassuring.  EKG shows no evidence of acute arrhythmia or ischemia.  Troponin x 2 negative.  Chest x-ray without pneumothorax or pneumonia.  Viral testing is negative.  Overall workup reassuring.  Given history and risk factors, will have patient follow-up with cardiology as an outpatient.  (Labs, imaging, consults)  Labs: I Ordered, and  personally interpreted labs.  The pertinent results include: CBC, BMP, troponin x 2, COVID, influenza  Imaging Studies ordered: I ordered imaging studies including chest x-ray I independently visualized and interpreted imaging. I agree with the radiologist interpretation  Additional history obtained from chart review.  External records from outside source obtained and reviewed including prior evaluations  Cardiac Monitoring: The patient was maintained on a cardiac monitor.  If on the cardiac monitor, I personally viewed and interpreted the cardiac monitored which showed an underlying rhythm of: Sinus  Reevaluation: After the interventions noted above, I reevaluated the patient and found that they have :stayed the same  Social Determinants of Health:  lives independently  Disposition: Discharge  Co morbidities that complicate the patient evaluation  Past Medical History:  Diagnosis Date   Allergy    Allergic rhinitis   GERD (gastroesophageal reflux disease)    Hypertension    Tobacco abuse      Medicines Meds ordered this encounter  Medications   acetaminophen  (TYLENOL ) tablet 1,000 mg   ibuprofen  (ADVIL ) tablet 400 mg    I have reviewed the patients home medicines and have made  adjustments as needed  Problem List / ED Course: Problem List Items Addressed This Visit       Other   Chest pain - Primary   Relevant Orders   Ambulatory referral to Cardiology             Final diagnoses:  Precordial pain    ED Discharge Orders          Ordered    Ambulatory referral to Cardiology        12/13/23 0432               Bari Charmaine FALCON, MD 12/13/23 [phone]

## 2023-12-13 NOTE — ED Triage Notes (Addendum)
 Pt BIB [facility] reporting chest pain and cough past few hours after walk outside. Denies SOB.

## 2023-12-13 NOTE — Discharge Instructions (Signed)
 You were seen for chest pain.  Your workup was reassuring including viral testing and heart testing.  Follow-up with cardiology as an outpatient.

## 2023-12-13 NOTE — ED Triage Notes (Signed)
 Pt BIB EMS from the [facility], c/o chest pain with inhalation, cough, and congestion. States that he got the flu shot last week.

## 2024-01-09 ENCOUNTER — Other Ambulatory Visit: Payer: Self-pay

## 2024-01-09 ENCOUNTER — Telehealth: Payer: Self-pay | Admitting: Nurse Practitioner

## 2024-01-09 DIAGNOSIS — G8929 Other chronic pain: Secondary | ICD-10-CM

## 2024-01-09 MED ORDER — IBUPROFEN 800 MG PO TABS
800.0000 mg | ORAL_TABLET | Freq: Three times a day (TID) | ORAL | 0 refills | Status: DC | PRN
  Filled 2024-01-09: qty 90, 30d supply, fill #0

## 2024-01-09 NOTE — Progress Notes (Signed)
 Acute Video Visit    Virtual Visit Consent:   Chris Hunt, you are scheduled for a virtual visit with a [facility] provider today.     Just as with appointments in the office, your consent must be obtained to participate.  Your consent will be active for this visit and any virtual visit you may have with one of our providers in the next 365 days.     If you have a MyChart account, a copy of this consent can be sent to you electronically.  All virtual visits are billed to your insurance company just like a traditional visit in the office.    If the connection with a video visit is poor, the visit may have to be switched to a telephone visit.  With either a video or telephone visit, we are not always able to ensure that we have a secure connection.     I need to obtain your verbal consent now.   Are you willing to proceed with your visit today?    Chris Hunt has provided verbal consent on 01/09/2024 for a virtual visit (video or telephone).   Chris Kitty, FNP  Date: 01/09/2024 3:26 PM  Subjective:     Patient ID: Chris Hunt, male    DOB: 1965-11-30, 58 y.o.   MRN: [id number]  LILLETTE Chris Hunt, connected with  Chris Hunt  ([id number], [birth year]) on 01/09/24 at  2:40 PM EST by a video-enabled telemedicine application and verified that I am speaking with the correct person using two identifiers.   Location: Patient: [facility]  Provider: Virtual Visit Location Provider: Home Office   I discussed the limitations of evaluation and management by telemedicine and the availability of in person appointments. The patient expressed understanding and agreed to proceed.    No chief complaint on file.   HPI Chris Hunt is a 58 y.o. who identifies as a male who was assigned male at birth, and is being seen today for chronic pain. He has had multiple injuries in the past and is also a Cytogeneticist.   Frequents the ED for pain, myalgias etc   Has been seen at the [facility] in the  past for allergy medications. Is currently working with the care worker to start services at the [facility] since he is a cytogeneticist.   Today his main concern is neck pain that is chronic and is worse in the cold and he cannot currently afford any medications   The pain in his neck does radiate into his left two middle fingers       Objective:      Physical Exam Constitutional:      Appearance: Normal appearance. He is not ill-appearing.  HENT:     Head: Normocephalic.     Nose: Nose normal.     Mouth/Throat:     Mouth: Mucous membranes are moist.  Pulmonary:     Effort: Pulmonary effort is normal.  Musculoskeletal:     Cervical back: Neck supple. Tenderness present.  Neurological:     Mental Status: He is alert and oriented to person, place, and time.  Psychiatric:        Mood and Affect: Mood normal.          Assessment & Plan:    May continue to use [facility] for acute needs while awaiting support from the [facility]    1. Other chronic pain (Primary)  - ibuprofen  (ADVIL ) 800 MG tablet; Take 1 tablet (800 mg  total) by mouth every 8 (eight) hours as needed for moderate pain (pain score 4-6).  Dispense: 90 tablet; Refill: 0  Follow Up Instructions: I discussed the assessment and treatment plan with the patient. The patient was provided an opportunity to ask questions and all were answered. The patient agreed with the plan and demonstrated an understanding of the instructions.  A copy of instructions were sent to the patient via MyChart unless otherwise noted below.    The patient was advised to call back or seek an in-person evaluation if the symptoms worsen or if the condition fails to improve as anticipated.    Chris Kitty, FNP  **Disclaimer: This note may have been dictated with voice recognition software. Similar sounding words can inadvertently be transcribed and this note may contain transcription errors which may not have been corrected upon publication of note.**

## 2024-01-16 ENCOUNTER — Ambulatory Visit: Payer: Self-pay | Admitting: Family Medicine

## 2024-01-16 ENCOUNTER — Encounter: Payer: Self-pay | Admitting: Family Medicine

## 2024-01-16 LAB — GLUCOSE, POCT (MANUAL RESULT ENTRY): POC Glucose: 105 mg/dL — AB (ref 70–99)

## 2024-01-17 ENCOUNTER — Other Ambulatory Visit: Payer: Self-pay

## 2024-01-18 ENCOUNTER — Other Ambulatory Visit: Payer: Self-pay

## 2024-01-18 NOTE — Progress Notes (Signed)
 Nursing Intake Note Paediatric Nurse Health  Chief Complaint: BP Check Living Situation: Un-Housed Insurance Status:      New Patient Status:  [x]  New to [employer] reviewed  HIPAA form signed and documented  Consent for digital charting: [x]  Signed []  Not Signed  Additional Notes:  Vital signs taken and entered in flowsheet  Interpreter services: []  Needed [x]  Not Needed  Patient oriented to mobile clinic services and process  SDOH screening completed  Referral to provider: []  Needed [x]  Not Needed  RN Interventions Provided:  [x]  Health education (e.g., chronic disease, hygiene, nutrition)

## 2024-02-04 ENCOUNTER — Other Ambulatory Visit: Payer: Self-pay

## 2024-02-04 ENCOUNTER — Emergency Department (HOSPITAL_COMMUNITY): Payer: Self-pay

## 2024-02-04 ENCOUNTER — Emergency Department (HOSPITAL_COMMUNITY)
Admission: EM | Admit: 2024-02-04 | Discharge: 2024-02-05 | Disposition: A | Payer: Self-pay | Attending: Emergency Medicine | Admitting: Emergency Medicine

## 2024-02-04 DIAGNOSIS — R051 Acute cough: Secondary | ICD-10-CM

## 2024-02-04 MED ORDER — IPRATROPIUM-ALBUTEROL 0.5-2.5 (3) MG/3ML IN SOLN
3.0000 mL | Freq: Once | RESPIRATORY_TRACT | Status: AC
  Administered 2024-02-05: 3 mL via RESPIRATORY_TRACT
  Filled 2024-02-04: qty 3

## 2024-02-04 NOTE — ED Provider Notes (Signed)
 [place] EMERGENCY DEPARTMENT AT [facility] Provider Note   CSN: [id number] Arrival date & time: 02/04/24  2339     Patient presents with: Chest Pain   Chris Hunt is a 58 y.o. male.  {Add pertinent medical, surgical, social history, OB history to YEP:67052} The history is provided by the patient.   Patient with history of hypertension, GERD presents with cough and bodyaches. Patient reports has been coughing for 2 months but does admit to ongoing tobacco use.  He reports over the past several hours he has had diffuse bodyaches and increasing cough.  He reports shortness of breath.  No active chest pain but just hurts all over  Patient was brought via EMS from a local homeless shelter   Past Medical History:  Diagnosis Date   Allergy    Allergic rhinitis   GERD (gastroesophageal reflux disease)    Hypertension    Tobacco abuse     Prior to Admission medications   Medication Sig Start Date End Date Taking? Authorizing Provider  fluticasone  (FLONASE ) 50 MCG/ACT nasal spray Place 1 spray into both nostrils daily for 7 days. 02/07/21 02/14/21  Elnor Jayson LABOR, DO  fluticasone  (FLONASE ) 50 MCG/ACT nasal spray Place 2 sprays into both nostrils daily. 12/06/23   Kennyth Domino, FNP  loratadine  (CLARITIN ) 10 MG tablet Take 1 tablet (10 mg total) by mouth daily. 12/06/23   Kennyth Domino, FNP  metoprolol  tartrate (LOPRESSOR ) 50 MG tablet TAKE 1 TABLET BY MOUTH TWICE DAILY 12/02/21   Norleen Lynwood ORN, MD    Allergies: Shellfish allergy and Lactose intolerance (gi)    Review of Systems  Respiratory:  Positive for cough.   Musculoskeletal:  Positive for myalgias.    Updated Vital Signs BP 131/84   Pulse 77   Temp 97.7 F (36.5 C) (Temporal)   Resp 18   SpO2 100%   Physical Exam CONSTITUTIONAL: Disheveled, no acute distress HEAD: Normocephalic/atraumatic EYES: EOMI ENMT: Mucous membranes moist, no stridor or drooling NECK: supple no meningeal  signs SPINE/BACK:entire spine nontender CV: S1/S2 noted, no murmurs/rubs/gallops noted LUNGS: Lungs are clear to auscultation bilaterally, no apparent distress, coughs frequently during exam ABDOMEN: soft, nontender NEURO: Pt is awake/alert/appropriate, moves all extremitiesx4.  No facial droop.   EXTREMITIES: pulses normal/equal, full ROM, no lower extremity edema or tenderness, no deformities SKIN: warm, color normal PSYCH: no abnormalities of mood noted, alert and oriented to situation  (all labs ordered are listed, but only abnormal results are displayed) Labs Reviewed  RESP PANEL BY RT-PCR (RSV, FLU A&B, COVID)  [id number]    EKG: None  Radiology: No results found.  {Document cardiac monitor, telemetry assessment procedure when appropriate:32947} Procedures   Medications Ordered in the ED  ipratropium-albuterol  (DUONEB) 0.5-2.5 (3) MG/3ML nebulizer solution 3 mL (has no administration in time range)      {Click here for ABCD2, HEART and other calculators REFRESH Note before signing:1}                              Medical Decision Making Amount and/or Complexity of Data Reviewed Radiology: ordered. ECG/medicine tests: ordered.  Risk Prescription drug management.   This patient presents to the ED for concern of cough, this involves an extensive number of treatment options, and is a complaint that carries with it a high risk of complications and morbidity.  The differential diagnosis includes but is not limited to influenza, COVID-19, pneumonia, bronchitis, COPD exacerbation,  acute pulmonary embolism, acute coronary syndrome  Comorbidities that complicate the patient evaluation: Patient's presentation is complicated by their history of hypertension  Social Determinants of Health: Patient's unhoused and tobacco use  increases the complexity of managing their presentation  Additional history obtained: Additional history obtained from EMS  Records reviewed Primary Care  Documents  Lab Tests: I Ordered, and personally interpreted labs.  The pertinent results include:  ***  Imaging Studies ordered: I ordered imaging studies including X-ray chest  I independently visualized and interpreted imaging which showed *** I agree with the radiologist interpretation  Cardiac Monitoring: The patient was maintained on a cardiac monitor.  I personally viewed and interpreted the cardiac monitor which showed an underlying rhythm of:  sinus rhythm  Medicines ordered and prescription drug management: I ordered medication including DuoNebs for cough and shortness of breath Reevaluation of the patient after these medicines showed that the patient    {resolved/improved/worsened:23923::improved}  Test Considered: Patient is low risk / negative by ***, therefore do not feel that *** is indicated.  Critical Interventions:  ***  Consultations Obtained: I requested consultation with the {consultation:26851}, and discussed  findings as well as pertinent plan - they recommend: ***  Reevaluation: After the interventions noted above, I reevaluated the patient and found that they have :{resolved/improved/worsened:23923::improved}  Complexity of problems addressed: Patient's presentation is most consistent with  acute presentation with potential threat to life or bodily function  Disposition: After consideration of the diagnostic results and the patient's response to treatment,  I feel that the patent would benefit from {disposition:26850}.     {Document critical care time when appropriate  Document review of labs and clinical decision tools ie CHADS2VASC2, etc  Document your independent review of radiology images and any outside records  Document your discussion with family members, caretakers and with consultants  Document social determinants of health affecting pt's care  Document your decision making why or why not admission, treatments were needed:32947:::1}    Final diagnoses:  None    ED Discharge Orders     None

## 2024-02-04 NOTE — ED Triage Notes (Signed)
 Pt arrives via [facility] from [facility] for L sided CP that starts this morning. Pt c/o being sore all over.   EMS last VS 1400/104, 97% on RA, RR 18, HR 68, CBG 163.

## 2024-02-05 LAB — RESP PANEL BY RT-PCR (RSV, FLU A&B, COVID)  RVPGX2
Influenza A by PCR: NEGATIVE
Influenza B by PCR: NEGATIVE
Resp Syncytial Virus by PCR: NEGATIVE
SARS Coronavirus 2 by RT PCR: NEGATIVE

## 2024-02-05 MED ORDER — ALBUTEROL SULFATE HFA 108 (90 BASE) MCG/ACT IN AERS
2.0000 | INHALATION_SPRAY | Freq: Once | RESPIRATORY_TRACT | Status: AC
  Administered 2024-02-05: 2 via RESPIRATORY_TRACT
  Filled 2024-02-05: qty 6.7

## 2024-02-05 MED ORDER — DEXAMETHASONE 4 MG PO TABS
12.0000 mg | ORAL_TABLET | Freq: Once | ORAL | Status: AC
  Administered 2024-02-05: 12 mg via ORAL
  Filled 2024-02-05: qty 3

## 2024-02-05 NOTE — ED Notes (Signed)
 Pt provided sandwich bag and water per MD and RN order

## 2024-02-07 ENCOUNTER — Other Ambulatory Visit: Payer: Self-pay

## 2024-02-07 ENCOUNTER — Encounter: Payer: Self-pay | Admitting: Physician Assistant

## 2024-02-07 ENCOUNTER — Ambulatory Visit: Payer: Self-pay | Admitting: Physician Assistant

## 2024-02-07 VITALS — BP 109/73 | HR 88 | Temp 98.3°F | Ht 72.0 in | Wt 205.0 lb

## 2024-02-07 DIAGNOSIS — F17210 Nicotine dependence, cigarettes, uncomplicated: Secondary | ICD-10-CM

## 2024-02-07 DIAGNOSIS — B9689 Other specified bacterial agents as the cause of diseases classified elsewhere: Secondary | ICD-10-CM

## 2024-02-07 DIAGNOSIS — J208 Acute bronchitis due to other specified organisms: Secondary | ICD-10-CM

## 2024-02-07 MED ORDER — METHYLPREDNISOLONE ACETATE 80 MG/ML IJ SUSP
80.0000 mg | Freq: Once | INTRAMUSCULAR | Status: AC
  Administered 2024-02-07: 80 mg via INTRAMUSCULAR

## 2024-02-07 MED ORDER — PROMETHAZINE-DM 6.25-15 MG/5ML PO SYRP
5.0000 mL | ORAL_SOLUTION | Freq: Four times a day (QID) | ORAL | 0 refills | Status: AC | PRN
  Filled 2024-02-07: qty 118, 6d supply, fill #0

## 2024-02-07 MED ORDER — ALBUTEROL SULFATE HFA 108 (90 BASE) MCG/ACT IN AERS
2.0000 | INHALATION_SPRAY | Freq: Once | RESPIRATORY_TRACT | Status: AC
  Administered 2024-02-07: 2 via RESPIRATORY_TRACT

## 2024-02-07 MED ORDER — AZITHROMYCIN 250 MG PO TABS
ORAL_TABLET | ORAL | 0 refills | Status: AC
  Filled 2024-02-07: qty 6, 5d supply, fill #0

## 2024-02-07 NOTE — Progress Notes (Signed)
 New Patient Office Visit  Subjective    Patient ID: Chris Hunt, male    DOB: Sep 06, 1965  Age: 58 y.o. MRN: [id number]  CC:  Chief Complaint  Patient presents with   URI    States he has had symptoms for almost 3 months     Discussed the use of AI scribe software for clinical note transcription with the patient, who gave verbal consent to proceed.  History of Present Illness   Chris Hunt is a 58 year old male who presents with persistent cold symptoms and cough for over three months.  He reports daily coughing, sneezing, cramping, diffuse body soreness, and brief chest discomfort for about half an hour each day for over three months, starting in early September. He describes constant pain throughout the day.  He was seen in the ER in September for 5 days of laryngitis and completed a steroid pack. On October 8th, during a virtual visit for similar complaints, he was prescribed Flonase , Claritin , Benadryl , and cough syrup but did not pick them up.  On December 7th, he returned to the ER for cough. An EKG was done that was WN. He received an albuterol  inhaler and a breathing treatment, which improved his symptoms. COVID and flu tests were negative. He lost the inhaler and has been using Nyquil and DayQuil instead.  He notes yellow sputum with streaks of blood. He takes Tylenol  and ibuprofen  for pain. He has no medication allergies but is allergic to shellfish.  He reports intermittent wheezing with breathing.  Outpatient Encounter Medications as of 02/07/2024  Medication Sig   azithromycin  (ZITHROMAX ) 250 MG tablet Take 2 tablets (500 mg total) by mouth daily for 1 day, THEN 1 tablet (250 mg total) daily for 4 days.   promethazine -dextromethorphan (PROMETHAZINE -DM) 6.25-15 MG/5ML syrup Take 5 mLs by mouth 4 (four) times daily as needed for up to 5 days for cough.   fluticasone  (FLONASE ) 50 MCG/ACT nasal spray Place 1 spray into both nostrils daily for 7 days.    fluticasone  (FLONASE ) 50 MCG/ACT nasal spray Place 2 sprays into both nostrils daily.   loratadine  (CLARITIN ) 10 MG tablet Take 1 tablet (10 mg total) by mouth daily.   metoprolol  tartrate (LOPRESSOR ) 50 MG tablet TAKE 1 TABLET BY MOUTH TWICE DAILY   [EXPIRED] albuterol  (VENTOLIN  HFA) 108 (90 Base) MCG/ACT inhaler 2 puff    [EXPIRED] methylPREDNISolone  acetate (DEPO-MEDROL ) injection 80 mg    No facility-administered encounter medications on file as of 02/07/2024.    Past Medical History:  Diagnosis Date   Allergy    Allergic rhinitis   GERD (gastroesophageal reflux disease)    Hypertension    Tobacco abuse     Past Surgical History:  Procedure Laterality Date   BACK SURGERY     ESOPHAGUS SURGERY  85    holes in esophagus   LUMBAR LAMINECTOMY/DECOMPRESSION MICRODISCECTOMY N/A 05/04/2012   Procedure: LUMBAR LAMINECTOMY/DECOMPRESSION MICRODISCECTOMY 2 LEVELS;  Surgeon: Catalina CHRISTELLA Stains, MD;  Location: [facility];  Service: Neurosurgery;  Laterality: N/A;  Bilateral Lumbar Five-Sacral One Diskectomy/Left Lumbar Four-Five Foraminotomy     Family History  Problem Relation Age of Onset   Cancer Father    Diabetes Mother    Hypertension Mother    Heart failure Maternal Grandmother    Colon cancer Neg Hx    Colon polyps Neg Hx    Esophageal cancer Neg Hx    Rectal cancer Neg Hx    Stomach cancer Neg Hx  Social History   Socioeconomic History   Marital status: Married    Spouse name: Not on file   Number of children: Not on file   Years of education: Not on file   Highest education level: Not on file  Occupational History    Employer: [employer]  Tobacco Use   Smoking status: Every Day    Current packs/day: 0.50    Types: Cigarettes   Smokeless tobacco: Never  Vaping Use   Vaping status: Never Used  Substance and Sexual Activity   Alcohol use: Yes    Alcohol/week: 12.0 standard drinks of alcohol    Types: 12 Cans of beer per week    Comment: 3-4  week/liquor   Drug use: No   Sexual activity: Not on file  Other Topics Concern   Not on file  Social History Narrative   Not on file   Social Drivers of Health   Financial Resource Strain: Not on file  Food Insecurity: Not on file  Transportation Needs: Not on file  Physical Activity: Not on file  Stress: Not on file  Social Connections: Not on file  Intimate Partner Violence: Not on file    Review of Systems  Constitutional:  Negative for chills and fever.  HENT:  Positive for congestion. Negative for sore throat.   Eyes: Negative.   Respiratory:  Positive for cough, sputum production and wheezing. Negative for shortness of breath.   Cardiovascular:  Negative for chest pain.  Gastrointestinal:  Negative for nausea and vomiting.  Genitourinary: Negative.   Musculoskeletal: Negative.   Skin: Negative.   Neurological: Negative.   Endo/Heme/Allergies: Negative.   Psychiatric/Behavioral: Negative.          Objective    BP 109/73 (BP Location: Left Arm, Patient Position: Sitting, Cuff Size: Large)   Pulse 88   Temp 98.3 F (36.8 C)   Ht 6' (1.829 m)   Wt 205 lb (93 kg)   SpO2 97%   BMI 27.80 kg/m   Physical Exam Vitals and nursing note reviewed.  Constitutional:      Appearance: Normal appearance.  HENT:     Head: Normocephalic and atraumatic.     Right Ear: External ear normal.     Left Ear: External ear normal.     Nose: Nose normal.     Mouth/Throat:     Mouth: Mucous membranes are moist.     Pharynx: Oropharynx is clear.  Eyes:     Extraocular Movements: Extraocular movements intact.     Conjunctiva/sclera: Conjunctivae normal.     Pupils: Pupils are equal, round, and reactive to light.  Cardiovascular:     Rate and Rhythm: Normal rate and regular rhythm.     Pulses: Normal pulses.     Heart sounds: Normal heart sounds.  Pulmonary:     Effort: Pulmonary effort is normal.     Breath sounds: Examination of the right-upper field reveals wheezing.  Examination of the left-upper field reveals wheezing. Wheezing present.     Comments: Very slight wheeze noted  Musculoskeletal:        General: Normal range of motion.     Cervical back: Normal range of motion and neck supple.  Skin:    General: Skin is warm and dry.  Neurological:     General: No focal deficit present.     Mental Status: He is alert and oriented to person, place, and time.  Psychiatric:        Mood and  Affect: Mood normal.        Behavior: Behavior normal.        Thought Content: Thought content normal.        Judgment: Judgment normal.         Assessment & Plan:   Problem List Items Addressed This Visit   None Visit Diagnoses       Acute bacterial bronchitis    -  Primary   Relevant Medications   methylPREDNISolone  acetate (DEPO-MEDROL ) injection 80 mg (Completed)   albuterol  (VENTOLIN  HFA) 108 (90 Base) MCG/ACT inhaler 2 puff (Completed)   promethazine -dextromethorphan (PROMETHAZINE -DM) 6.25-15 MG/5ML syrup   azithromycin  (ZITHROMAX ) 250 MG tablet       Results LABS COVID: Negative (02/04/2024) Flu: Negative (02/04/2024)  DIAGNOSTIC REPORTS EKG: Normal (02/04/2024)  Assessment and Plan Acute bacterial bronchitis Persistent cough with yellow and red phlegm for over three months, indicating possible bacterial infection. Wheezing present, likely due to bronchitis. He agreed to take antibiotics after initial reluctance. - Administered steroid injection  - Prescribed albuterol  inhaler for wheezing. - Prescribed azithromycin  for bacterial infection. - Refilled cough medicine.  The patient was given clear instructions to go to ER or return to medical center if symptoms don't improve, worsen or new problems develop. The patient verbalized understanding.    I have reviewed the patient's medical history (PMH, PSH, Social History, Family History, Medications, and allergies) , and have been updated if relevant. I spent 30 minutes reviewing chart and   face to face time with patient.      Return if symptoms worsen or fail to improve.   Kirk RAMAN Mayers, PA-C

## 2024-02-07 NOTE — Patient Instructions (Signed)
 VISIT SUMMARY:  Today, we discussed your persistent cold symptoms and cough that have been ongoing for over three months. You reported daily coughing, sneezing, body soreness, and brief chest discomfort. We reviewed your recent ER visits and treatments, including the use of an albuterol  inhaler and negative COVID and flu tests. You mentioned yellow sputum with streaks of blood and intermittent wheezing. We have developed a plan to address your symptoms and improve your condition.  YOUR PLAN:  -ACUTE BACTERIAL BRONCHITIS: Acute bacterial bronchitis is an infection of the airways in your lungs, causing a persistent cough with yellow and red phlegm. To help open your airways, you received a steroid injection. You have been prescribed an albuterol  inhaler to use for wheezing and azithromycin  to treat the bacterial infection. Additionally, your cough medicine has been refilled.   INSTRUCTIONS:  Please follow up with your primary care provider to monitor your progress and ensure the treatments are effective. If your symptoms worsen or you experience new symptoms, seek medical attention immediately.  Acute Bronchitis, Adult  Acute bronchitis is sudden inflammation of the main airways (bronchi) that come off the windpipe (trachea) in the lungs. The swelling causes the airways to get smaller and make more mucus than normal. This can make it hard to breathe and can cause coughing or noisy breathing (wheezing). Acute bronchitis may last several weeks. The cough may last longer. Allergies, asthma, and exposure to smoke may make the condition worse. What are the causes? This condition can be caused by germs and by substances that irritate the lungs, including: Cold and flu viruses. The most common cause of this condition is the virus that causes the common cold. Bacteria. This is less common. Breathing in substances that irritate the lungs, including: Smoke from cigarettes and other forms of tobacco. Dust  and pollen. Fumes from household cleaning products, gases, or burned fuel. Indoor or outdoor air pollution. What increases the risk? The following factors may make you more likely to develop this condition: A weak body's defense system, also called the immune system. A condition that affects your lungs and breathing, such as asthma. What are the signs or symptoms? Common symptoms of this condition include: Coughing. This may bring up clear, yellow, or green mucus from your lungs (sputum). Wheezing. Runny or stuffy nose. Having too much mucus in your lungs (chest congestion). Shortness of breath. Aches and pains, including sore throat or chest. How is this diagnosed? This condition is usually diagnosed based on: Your symptoms and medical history. A physical exam. You may also have other tests, including tests to rule out other conditions, such as pneumonia. These tests include: A test of lung function. Test of a mucus sample to look for the presence of bacteria. Tests to check the oxygen  level in your blood. Blood tests. Chest X-ray. How is this treated? Most cases of acute bronchitis clear up over time without treatment. Your health care provider may recommend: Drinking more fluids to help thin your mucus so it is easier to cough up. Taking inhaled medicine (inhaler) to improve air flow in and out of your lungs. Using a vaporizer or a humidifier. These are machines that add water to the air to help you breathe better. Taking a medicine that thins mucus and clears congestion (expectorant). Taking a medicine that prevents or stops coughing (cough suppressant). It is not common to take an antibiotic medicine for this condition. Follow these instructions at home:  Take over-the-counter and prescription medicines only as told by your  health care provider. Use an inhaler, vaporizer, or humidifier as told by your health care provider. Take two teaspoons (10 mL) of honey at bedtime to  lessen coughing at night. Drink enough fluid to keep your urine pale yellow. Do not use any products that contain nicotine or tobacco. These products include cigarettes, chewing tobacco, and vaping devices, such as e-cigarettes. If you need help quitting, ask your health care provider. Get plenty of rest. Return to your normal activities as told by your health care provider. Ask your health care provider what activities are safe for you. Keep all follow-up visits. This is important. How is this prevented? To lower your risk of getting this condition again: Wash your hands often with soap and water for at least 20 seconds. If soap and water are not available, use hand sanitizer. Avoid contact with people who have cold symptoms. Try not to touch your mouth, nose, or eyes with your hands. Avoid breathing in smoke or chemical fumes. Breathing smoke or chemical fumes will make your condition worse. Get the flu shot every year. Contact a health care provider if: Your symptoms do not improve after 2 weeks. You have trouble coughing up the mucus. Your cough keeps you awake at night. You have a fever. Get help right away if you: Cough up blood. Feel pain in your chest. Have severe shortness of breath. Faint or keep feeling like you are going to faint. Have a severe headache. Have a fever or chills that get worse. These symptoms may represent a serious problem that is an emergency. Do not wait to see if the symptoms will go away. Get medical help right away. Call your local emergency services (911 in the [place] Do not drive yourself to the hospital. Summary Acute bronchitis is inflammation of the main airways (bronchi) that come off the windpipe (trachea) in the lungs. The swelling causes the airways to get smaller and make more mucus than normal. Drinking more fluids can help thin your mucus so it is easier to cough up. Take over-the-counter and prescription medicines only as told by your health  care provider. Do not use any products that contain nicotine or tobacco. These products include cigarettes, chewing tobacco, and vaping devices, such as e-cigarettes. If you need help quitting, ask your health care provider. Contact a health care provider if your symptoms do not improve after 2 weeks. This information is not intended to replace advice given to you by your health care provider. Make sure you discuss any questions you have with your health care provider. Document Revised: 05/27/2021 Document Reviewed: 06/17/2020 Elsevier Patient Education  2024 [employer].

## 2024-02-08 NOTE — Congregational Nurse Program (Signed)
°  Dept: [phone]   Congregational Nurse Program Note  Date of Encounter: 02/08/2024  Past Medical History: Past Medical History:  Diagnosis Date   Allergy    Allergic rhinitis   GERD (gastroesophageal reflux disease)    Hypertension    Tobacco abuse     Encounter Details:  Community Questionnaire - 02/08/24 1737       Questionnaire   Ask client: Do you give verbal consent for me to treat you today? Yes    Student Assistance N/A    Location Patient Served  [facility]    Encounter Setting Phone/Text/Email    Population Status Unknown    Insurance Unknown    Insurance/Financial Assistance Referral N/A    Medication Have Medication Insecurities    Medical Provider Yes    Screening Referrals Made N/A    Medical Referrals Made N/A    Medical Appointment Completed [facility]    CNP Interventions Case Management;Navigate Healthcare System    Screenings CN Performed N/A    ED Visit Averted N/A    Life-Saving Intervention Made N/A         RN received notice from congregational nurse at [facility] that client needs assistance with medications per PA that treated him. Message left at number on client's chart to offer assistance.

## 2024-02-15 ENCOUNTER — Other Ambulatory Visit: Payer: Self-pay

## 2024-02-15 NOTE — Congregational Nurse Program (Addendum)
°  Dept: [phone]   Congregational Nurse Program Note  Date of Encounter: 02/15/2024  Past Medical History: Past Medical History:  Diagnosis Date   Allergy    Allergic rhinitis   GERD (gastroesophageal reflux disease)    Hypertension    Tobacco abuse     Encounter Details:  Community Questionnaire - 02/15/24 1139       Questionnaire   Ask client: Do you give verbal consent for me to treat you today? Yes    Student Assistance N/A    Location Patient Served  [facility]    Encounter Setting CN site    Population Status Unknown    Insurance Unknown    Insurance/Financial Assistance Referral N/A    Medication Have Medication Insecurities;Patient Medications Reviewed;Provided Medication Assistance    Medical Provider Yes    Screening Referrals Made N/A    Medical Referrals Made N/A    Medical Appointment Completed [facility]    CNP Interventions Case Management;Navigate Healthcare System;Counsel;Educate;Advocate/Support    Screenings CN Performed N/A    ED Visit Averted Yes    Life-Saving Intervention Made N/A          Client requested to see RN. Client states he needs help getting medications that have been prescribed for him. Client states meds are at [facility]. RN will pick up and deliver to client at [facility]. Client denies any other acute needs at this time. RN called client to let him know RN had RX at [facility], unable to get through. Client was informed that RN would only be here until 1530.

## 2024-02-18 ENCOUNTER — Emergency Department (HOSPITAL_COMMUNITY): Payer: Self-pay

## 2024-02-18 ENCOUNTER — Other Ambulatory Visit: Payer: Self-pay

## 2024-02-18 ENCOUNTER — Emergency Department (HOSPITAL_COMMUNITY)
Admission: EM | Admit: 2024-02-18 | Discharge: 2024-02-19 | Disposition: A | Discharge status: Home / Self Care | Payer: Self-pay | Attending: Emergency Medicine | Admitting: Emergency Medicine

## 2024-02-18 DIAGNOSIS — R059 Cough, unspecified: Secondary | ICD-10-CM | POA: Insufficient documentation

## 2024-02-18 DIAGNOSIS — I10 Essential (primary) hypertension: Secondary | ICD-10-CM | POA: Insufficient documentation

## 2024-02-18 DIAGNOSIS — M791 Myalgia, unspecified site: Secondary | ICD-10-CM | POA: Insufficient documentation

## 2024-02-18 DIAGNOSIS — X501XXA Overexertion from prolonged static or awkward postures, initial encounter: Secondary | ICD-10-CM | POA: Insufficient documentation

## 2024-02-18 DIAGNOSIS — M25571 Pain in right ankle and joints of right foot: Secondary | ICD-10-CM | POA: Insufficient documentation

## 2024-02-18 DIAGNOSIS — F17200 Nicotine dependence, unspecified, uncomplicated: Secondary | ICD-10-CM | POA: Insufficient documentation

## 2024-02-18 DIAGNOSIS — Z79899 Other long term (current) drug therapy: Secondary | ICD-10-CM | POA: Insufficient documentation

## 2024-02-18 NOTE — ED Triage Notes (Signed)
 Patient c/o right ankle pain x 1 week. Patient report he thinks he rolled his right foot while walking. Patient report cough and generalized body aches too.

## 2024-02-19 LAB — RESP PANEL BY RT-PCR (RSV, FLU A&B, COVID)  RVPGX2
Influenza A by PCR: NEGATIVE
Influenza B by PCR: NEGATIVE
Resp Syncytial Virus by PCR: NEGATIVE
SARS Coronavirus 2 by RT PCR: NEGATIVE

## 2024-02-19 NOTE — Discharge Instructions (Signed)
 Your x-ray was reassuring this morning.  You may use the boot that was provided to support the ankle.  Follow-up as needed with orthopedics and primary care.  Take over-the-counter medicine for pain management.

## 2024-02-19 NOTE — ED Provider Notes (Signed)
 " [place] EMERGENCY DEPARTMENT AT [facility] Provider Note   CSN: [id number] Arrival date & time: 02/18/24  2235     Patient presents with: Ankle Pain   Chris Hunt is a 58 y.o. male.  Patient with past medical history significant for tobacco abuse, hypertension, GERD presents to the emergency room complaining of right sided ankle pain and generalized bodyaches.  He states that he believes he might of rolled his right ankle while walking approximately 1 week ago.  Patient also endorses a cough.  He denies chest pain, shortness of breath, abdominal pain, nausea, vomiting.  At the time of my initial assessment patient was sleeping soundly and had to be awakened to complete assessment.    Ankle Pain      Prior to Admission medications  Medication Sig Start Date End Date Taking? Authorizing Provider  azithromycin  (ZITHROMAX ) 250 MG tablet Take 2 tablets (500 mg total) by mouth daily for 1 day, THEN 1 tablet (250 mg total) daily for 4 days. 02/07/24 02/20/24  Mayers, Cari S, PA-C  fluticasone  (FLONASE ) 50 MCG/ACT nasal spray Place 1 spray into both nostrils daily for 7 days. 02/07/21 02/14/21  Elnor Savant A, DO  fluticasone  (FLONASE ) 50 MCG/ACT nasal spray Place 2 sprays into both nostrils daily. 12/06/23   Kennyth Domino, FNP  loratadine  (CLARITIN ) 10 MG tablet Take 1 tablet (10 mg total) by mouth daily. 12/06/23   Kennyth Domino, FNP  metoprolol  tartrate (LOPRESSOR ) 50 MG tablet TAKE 1 TABLET BY MOUTH TWICE DAILY 12/02/21   Norleen Lynwood ORN, MD  promethazine -dextromethorphan (PROMETHAZINE -DM) 6.25-15 MG/5ML syrup Take 5 mLs by mouth 4 (four) times daily as needed for up to 5 days for cough. 02/07/24 02/21/24  Mayers, Kirk RAMAN, PA-C    Allergies: Shellfish allergy and Lactose intolerance (gi)    Review of Systems  Updated Vital Signs BP 119/80 (BP Location: Right Arm)   Pulse (!) 58   Temp 97.8 F (36.6 C) (Oral)   Resp 16   SpO2 98%   Physical Exam Vitals and  nursing note reviewed.  HENT:     Head: Normocephalic and atraumatic.  Eyes:     Pupils: Pupils are equal, round, and reactive to light.  Pulmonary:     Effort: Pulmonary effort is normal. No respiratory distress.  Musculoskeletal:        General: Tenderness present. No swelling, deformity or signs of injury. Normal range of motion.     Cervical back: Normal range of motion.     Comments: Grossly normal range of motion of the right foot and ankle.  Palpable pedal pulse.  No swelling appreciated.  Mild tenderness to palpation over the lateral malleolus.  Skin:    General: Skin is dry.  Neurological:     Mental Status: He is alert.  Psychiatric:        Speech: Speech normal.        Behavior: Behavior normal.     (all labs ordered are listed, but only abnormal results are displayed) Labs Reviewed  RESP PANEL BY RT-PCR (RSV, FLU A&B, COVID)  [id number]    EKG: None  Radiology: DG Ankle Complete Right Result Date: 02/18/2024 EXAM: 3 or more VIEW(S) XRAY OF THE RIGHT ANKLE 02/18/2024 11:23:16 PM CLINICAL HISTORY: ankle pain COMPARISON: 03/09/2019 FINDINGS: BONES AND JOINTS: No acute fracture. No malalignment. Pes planus. SOFT TISSUES: Mild soft tissue swelling about the ankle. Vascular calcifications. IMPRESSION: 1. Mild soft tissue swelling about the ankle. No fracture. Electronically signed  by: Norman Gatlin MD 02/18/2024 11:26 PM EST RP Workstation: [id number]     .Ortho Injury Treatment  Date/Time: 02/19/2024 3:17 AM  Performed by: Logan Ubaldo NOVAK, PA-C Authorized by: Logan Ubaldo NOVAK, PA-C   Consent:    Consent obtained:  Verbal   Consent given by:  PatientInjury location: ankle Location details: right ankle Injury type: soft tissue Pre-procedure neurovascular assessment: neurovascularly intact Immobilization: CAM boot. Splint Applied by: ED Nurse Post-procedure neurovascular assessment: post-procedure neurovascularly intact      Medications Ordered in the ED -  No data to display                                  Medical Decision Making Amount and/or Complexity of Data Reviewed Radiology: ordered.   Patient presents to the emergency room with a chief complaint of ankle pain.  Differential includes sprain, fracture, dislocation, others  I reviewed outpatient primary care notes  The patient was placed in a cam walker boot.  This was after I ordered and interpreted imaging including plain films of the right ankle with no acute findings.  Patient is apparently housing insecure at this time which complicates his care and recovery  Suspect ankle sprain.  Patient may follow-up with primary care and orthopedic surgery as needed.  Discharge at this time     Final diagnoses:  Acute right ankle pain    ED Discharge Orders     None          Logan Ubaldo NOVAK DEVONNA 02/19/24 9681    Trine Raynell Moder, MD 02/19/24 [phone]  "

## 2024-03-03 ENCOUNTER — Other Ambulatory Visit: Payer: Self-pay

## 2024-03-03 ENCOUNTER — Emergency Department (HOSPITAL_COMMUNITY)
Admission: EM | Admit: 2024-03-03 | Discharge: 2024-03-03 | Discharge status: Against Medical Advice | Payer: Self-pay | Attending: Emergency Medicine | Admitting: Emergency Medicine

## 2024-03-03 ENCOUNTER — Encounter (HOSPITAL_COMMUNITY): Payer: Self-pay

## 2024-03-03 DIAGNOSIS — M791 Myalgia, unspecified site: Secondary | ICD-10-CM | POA: Insufficient documentation

## 2024-03-03 DIAGNOSIS — R059 Cough, unspecified: Secondary | ICD-10-CM | POA: Insufficient documentation

## 2024-03-03 DIAGNOSIS — R0981 Nasal congestion: Secondary | ICD-10-CM | POA: Insufficient documentation

## 2024-03-03 DIAGNOSIS — Z5321 Procedure and treatment not carried out due to patient leaving prior to being seen by health care provider: Secondary | ICD-10-CM | POA: Insufficient documentation

## 2024-03-03 LAB — RESP PANEL BY RT-PCR (RSV, FLU A&B, COVID)  RVPGX2
Influenza A by PCR: NEGATIVE
Influenza B by PCR: NEGATIVE
Resp Syncytial Virus by PCR: NEGATIVE
SARS Coronavirus 2 by RT PCR: NEGATIVE

## 2024-03-03 NOTE — ED Notes (Signed)
 Patient said he is leaving. Did not state why.

## 2024-03-03 NOTE — ED Triage Notes (Signed)
 Complaining of body aches that started about 4-5 hours ago. Cough and congestion as well.

## 2024-03-07 ENCOUNTER — Other Ambulatory Visit: Payer: Self-pay

## 2024-03-07 ENCOUNTER — Encounter (HOSPITAL_COMMUNITY): Payer: Self-pay

## 2024-03-07 ENCOUNTER — Emergency Department (HOSPITAL_COMMUNITY): Payer: Self-pay

## 2024-03-07 ENCOUNTER — Emergency Department (HOSPITAL_COMMUNITY)
Admission: EM | Admit: 2024-03-07 | Discharge: 2024-03-07 | Disposition: A | Discharge status: Home / Self Care | Payer: Self-pay | Attending: Emergency Medicine | Admitting: Emergency Medicine

## 2024-03-07 DIAGNOSIS — I10 Essential (primary) hypertension: Secondary | ICD-10-CM | POA: Insufficient documentation

## 2024-03-07 DIAGNOSIS — R52 Pain, unspecified: Secondary | ICD-10-CM

## 2024-03-07 DIAGNOSIS — J069 Acute upper respiratory infection, unspecified: Secondary | ICD-10-CM | POA: Insufficient documentation

## 2024-03-07 DIAGNOSIS — M25571 Pain in right ankle and joints of right foot: Secondary | ICD-10-CM | POA: Insufficient documentation

## 2024-03-07 DIAGNOSIS — E871 Hypo-osmolality and hyponatremia: Secondary | ICD-10-CM | POA: Insufficient documentation

## 2024-03-07 DIAGNOSIS — Z79899 Other long term (current) drug therapy: Secondary | ICD-10-CM | POA: Insufficient documentation

## 2024-03-07 DIAGNOSIS — Z87891 Personal history of nicotine dependence: Secondary | ICD-10-CM | POA: Insufficient documentation

## 2024-03-07 LAB — BASIC METABOLIC PANEL WITH GFR
Anion gap: 10 (ref 5–15)
BUN: 12 mg/dL (ref 6–20)
CO2: 22 mmol/L (ref 22–32)
Calcium: 9.1 mg/dL (ref 8.9–10.3)
Chloride: 102 mmol/L (ref 98–111)
Creatinine, Ser: 1.07 mg/dL (ref 0.61–1.24)
GFR, Estimated: >60 mL/min
Glucose, Bld: 112 mg/dL — ABNORMAL HIGH (ref 70–99)
Potassium: 4 mmol/L (ref 3.5–5.1)
Sodium: 134 mmol/L — ABNORMAL LOW (ref 135–145)

## 2024-03-07 LAB — CBC
HCT: 42.6 % (ref 39.0–52.0)
Hemoglobin: 14 g/dL (ref 13.0–17.0)
MCH: 28.8 pg (ref 26.0–34.0)
MCHC: 32.9 g/dL (ref 30.0–36.0)
MCV: 87.7 fL (ref 80.0–100.0)
Platelets: 217 K/uL (ref 150–400)
RBC: 4.86 MIL/uL (ref 4.22–5.81)
RDW: 15.4 % (ref 11.5–15.5)
WBC: 5.4 K/uL (ref 4.0–10.5)
nRBC: 0 % (ref 0.0–0.2)

## 2024-03-07 MED ORDER — IBUPROFEN 400 MG PO TABS
600.0000 mg | ORAL_TABLET | Freq: Once | ORAL | Status: AC
  Administered 2024-03-07: 600 mg via ORAL
  Filled 2024-03-07: qty 1

## 2024-03-07 MED ORDER — ACETAMINOPHEN 325 MG PO TABS
650.0000 mg | ORAL_TABLET | Freq: Once | ORAL | Status: AC
  Administered 2024-03-07: 650 mg via ORAL
  Filled 2024-03-07: qty 2

## 2024-03-07 NOTE — ED Provider Notes (Signed)
 " [place] EMERGENCY DEPARTMENT AT [facility] Provider Note   CSN: [id number] Arrival date & time: 03/07/24  0023     Patient presents with: Headache   Chris Hunt is a 59 y.o. male.   Chris Hunt is a 59 y.o. male with a history of tobacco use, hypertension and GERD, who presents to the emergency department for reports of generalized bodyaches and cramping in both of his legs.  He was seen a few days ago for upper respiratory symptoms with associated fevers and chills and at that time had negative COVID and flu testing.  He reports that he has continued to have some of those symptoms although they do seem to be improving, he returned to work and does strenuous work on his feet on rough terrain throughout the day and started to feel cramping throughout both of his legs while working yesterday.  He thought this was likely due to not drinking enough water.  He reports since being in the emergency department and resting the cramping has improved.  He denies any swelling in his legs, no redness or warmth.  He denies any chest pain or shortness of breath has continued to have some bodyaches and intermittent headache.  He also specifically complains of pain in his right ankle and reports a history of remote injury several months ago that he feels like has been exacerbated by working on uneven ground he wears tennis shoes to work daily.  Denies any falls.  No other aggravating or alleviating factors.  The history is provided by the patient and medical records.  Headache Associated symptoms: congestion, cough, fever and myalgias   Associated symptoms: no abdominal pain, no nausea and no vomiting        Prior to Admission medications  Medication Sig Start Date End Date Taking? Authorizing Provider  fluticasone  (FLONASE ) 50 MCG/ACT nasal spray Place 1 spray into both nostrils daily for 7 days. 02/07/21 02/14/21  Elnor Jayson LABOR, DO  fluticasone  (FLONASE ) 50 MCG/ACT nasal spray  Place 2 sprays into both nostrils daily. 12/06/23   Kennyth Domino, FNP  loratadine  (CLARITIN ) 10 MG tablet Take 1 tablet (10 mg total) by mouth daily. 12/06/23   Kennyth Domino, FNP  metoprolol  tartrate (LOPRESSOR ) 50 MG tablet TAKE 1 TABLET BY MOUTH TWICE DAILY 12/02/21   Norleen Lynwood ORN, MD    Allergies: Shellfish allergy and Lactose intolerance (gi)    Review of Systems  Constitutional:  Positive for chills and fever.  HENT:  Positive for congestion.   Respiratory:  Positive for cough. Negative for shortness of breath.   Cardiovascular:  Negative for chest pain.  Gastrointestinal:  Negative for abdominal pain, nausea and vomiting.  Musculoskeletal:  Positive for arthralgias and myalgias.  Neurological:  Positive for headaches.  All other systems reviewed and are negative.   Updated Vital Signs BP (!) 148/86 (BP Location: Left Wrist)   Pulse 81   Temp (!) 100.4 F (38 C) (Oral)   Resp 15   SpO2 97%   Physical Exam Vitals and nursing note reviewed.  Constitutional:      General: He is not in acute distress.    Appearance: Normal appearance. He is well-developed and normal weight. He is not ill-appearing or diaphoretic.  HENT:     Head: Normocephalic and atraumatic.     Nose:     Comments: Small amount of clear rhinorrhea present    Mouth/Throat:     Mouth: Mucous membranes are moist.  Pharynx: Oropharynx is clear.     Comments: Posterior oropharynx clear without erythema, edema or exudate, uvula midline, no tonsillar swelling noted Eyes:     General:        Right eye: No discharge.        Left eye: No discharge.  Cardiovascular:     Rate and Rhythm: Normal rate and regular rhythm.     Heart sounds: Normal heart sounds.  Pulmonary:     Effort: Pulmonary effort is normal. No respiratory distress.     Breath sounds: Normal breath sounds.     Comments: Respirations are equal and unlabored and patient is able to speak in full sentences, lungs are clear to auscultation  bilaterally without wheezing, rhonchi or rales Abdominal:     General: Bowel sounds are normal. There is no distension.     Palpations: Abdomen is soft.     Tenderness: There is no abdominal tenderness.  Musculoskeletal:        General: Tenderness present.     Cervical back: Neck supple.     Comments: Patient has some mild tenderness over the right ankle primarily in the region of the ATFL and over the lateral malleolus, no medial malleolar or calcaneus tenderness, no tenderness throughout the forefoot, no swelling noted, 2+ distal pulses noted.  Range of motion intact.  No obvious deformity.  All other joints are supple and easily movable all compartments are soft without swelling or tenderness.  2+ distal pulses in all extremities.  Lymphadenopathy:     Cervical: No cervical adenopathy.  Skin:    General: Skin is warm and dry.     Findings: No bruising, erythema or rash.  Neurological:     Mental Status: He is alert and oriented to person, place, and time.     Coordination: Coordination normal.  Psychiatric:        Mood and Affect: Mood normal.        Behavior: Behavior normal.     (all labs ordered are listed, but only abnormal results are displayed) Labs Reviewed  BASIC METABOLIC PANEL WITH GFR - Abnormal; Notable for the following components:      Result Value   Sodium 134 (*)    Glucose, Bld 112 (*)    All other components within normal limits  CBC    EKG: None  Radiology: DG Ankle Complete Right Result Date: 03/07/2024 EXAM: 3 VIEW(S) XRAY OF THE RIGHT ANKLE 03/07/2024 08:43:00 AM CLINICAL HISTORY: 59 year old male. Ankle pain, prior injury. COMPARISON: Right ankle series 02/18/2024, 03/09/2019. FINDINGS: BONES AND JOINTS: Chronic but progressed since 2021, bulky degenerative spurring of the anterior talus and dorsal midfoot bones. No acute fracture. No malalignment. SOFT TISSUES: Arterial calcifications. IMPRESSION: 1. No acute osseous abnormality identified about the  right ankle. 2. Bulky chronic spurring of the anterior talus and dorsal midfoot, progressed since 2021. 3. Calcified peripheral vascular disease. Electronically signed by: Helayne Hurst MD MD 03/07/2024 08:49 AM EST RP Workstation: [id number]      Procedures   Medications Ordered in the ED  ibuprofen  (ADVIL ) tablet 600 mg (600 mg Oral Given 03/07/24 0823)  acetaminophen  (TYLENOL ) tablet 650 mg (650 mg Oral Given 03/07/24 9176)                                    Medical Decision Making Amount and/or Complexity of Data Reviewed Radiology: ordered.  Risk OTC drugs.   Patient  presents with myalgias and leg cramping, diagnosed with upper respiratory viral infection 4 days ago but negative for COVID and flu.  On arrival had low-grade temp but vitals otherwise normal this resolved with ibuprofen  and Tylenol .  Patient reports since resting leg cramping and myalgias has significantly improved and he has been able to ambulate without difficulty.  Does have some focal right ankle pain due to previous injury and working on uneven terrain.  There is some tenderness and swelling over the ATFL but no obvious deformity.  X-rays obtained.  Patient with clear lungs without chest pain or shortness of breath and no other concerning infectious symptoms.  Suspect dehydration and strenuous work in the setting of viral illness leading to myalgias and cramping.  Lab work fortunately without leukocytosis, leukopenia or significant electrolyte derangement and renal function is within normal limits.  X-ray of the right ankle viewed interpreted independently, fortunately no evidence of fracture or dislocation.  Patient given water to drink as well as ibuprofen  and Tylenol  to help with symptoms and is feeling much better.  Placed in ASO brace for extra support of the ankle and encouraged to wear boots that support the ankle when working on uneven terrain to help with his symptoms.  At this time there does not appear to  be any evidence of an acute emergency medical condition requiring further emergent evaluation and the patient appears stable for discharge with appropriate outpatient follow up. Diagnosis and return precautions discussed with patient who verbalizes understanding and is agreeable to discharge.       Final diagnoses:  Viral upper respiratory tract infection  Generalized body aches  Acute right ankle pain    ED Discharge Orders     None          Alva Larraine JULIANNA DEVONNA 03/09/24 1252    Rogelia Jerilynn RAMAN, MD 03/15/24 [phone]  "

## 2024-03-07 NOTE — Discharge Instructions (Addendum)
 Your symptoms are likely caused by a viral upper respiratory infection. Antibiotics are not helpful in treating viral infection, the virus should run its course in about 5-7 days. Please make sure you are drinking plenty of fluids. You can treat your symptoms supportively with tylenol /ibuprofen  for fevers and pains, Zyrtec  and Flonase  to help with nasal congestion, and over the counter cough syrups and throat lozenges to help with cough. If your symptoms are not improving please follow up with you Primary doctor.   Your ankle x-ray showed no fracture, keep usingf ibuprofen  and tylenol  as needed, and wear supportive ankle brace and high ankle work boots when working on uneven terrain.  If you develop persistent fevers, shortness of breath or difficulty breathing, chest pain, severe headache and neck pain, persistent nausea and vomiting or other new or concerning symptoms return to the Emergency department.

## 2024-03-07 NOTE — ED Notes (Signed)
 Patient transported to X-ray

## 2024-03-07 NOTE — ED Triage Notes (Signed)
 Pt is coming in for the same symptoms of body aches, with some cramping that he was seen for on the 4th of January. He reports a headache as well. No chest pain or shortness of breath at this time.

## 2024-03-07 NOTE — ED Triage Notes (Signed)
 Pt complaints of cramps in both legs. Stated not drinking enough water today: I Only drink two bottles of water after working all day.

## 2024-03-08 NOTE — Progress Notes (Unsigned)
 The patient presented for a primary care visit on 12/06/2023. The pt's vitals, labs, and SDOH screening were not conducted due to this being a video visit.   A review of the patient's chart revealed that his primary provider is Dr. Lynwood Rush - [facility]. His most recent appt with his PCP was on 06/19/2020. Ronal Jenkins Houseman, NP is also on his care team for his primary care needs. There are currently no [facility] visible encounters with this provider. Chart review further indicates that he has a history of smoking and no SDOH needs. Chart review shows that the pt has received support from Congregational Nurse, Mitzie Breen, RN on 02/15/2024 at the [facility]. Chart review shows an unknow encounter from the [place] system with a provider by the name of Wolm CHARLENA Needle on 01/29/2024. CHW ran RTE due to no insurance showing during chart review. No changes were reflected to his insurance in his chart at this time. Pt may have [place] insurance but this needs to be confirmed.   Call Attempt #1: CHW called pt about insurance, smoking cessation, and PCP status. CHW unable to reach pt due to this number no longer being valid.   Call Attempt #2: CHW called pt to discuss insurance, smoking cessation, and PCP status. The call could not be completed due to this being an invalid number.  Call Attempt #3: CHW called pt for third attempt to obtain PCP status, smoking cessation, and insurance. CHW is unable to leave a vm due to this number being invalid.   CHW sent a courtesy letter with [facility], [facility], [place], [place], [facility] Financial [employer]ers, and smoking cessation resources.   An additional follow up will be done according to the health equity team's protocol.

## 2024-03-25 ENCOUNTER — Emergency Department (HOSPITAL_COMMUNITY)
Admission: EM | Admit: 2024-03-25 | Discharge: 2024-03-25 | Disposition: A | Discharge status: Home / Self Care | Payer: Self-pay | Attending: Emergency Medicine | Admitting: Emergency Medicine

## 2024-03-25 ENCOUNTER — Other Ambulatory Visit: Payer: Self-pay

## 2024-03-25 DIAGNOSIS — G8929 Other chronic pain: Secondary | ICD-10-CM | POA: Insufficient documentation

## 2024-03-25 LAB — CBC WITH DIFFERENTIAL/PLATELET
Abs Immature Granulocytes: 0.02 10*3/uL (ref 0.00–0.07)
Basophils Absolute: 0 10*3/uL (ref 0.0–0.1)
Basophils Relative: 1 %
Eosinophils Absolute: 0.1 10*3/uL (ref 0.0–0.5)
Eosinophils Relative: 2 %
HCT: 44.9 % (ref 39.0–52.0)
Hemoglobin: 14.2 g/dL (ref 13.0–17.0)
Immature Granulocytes: 0 %
Lymphocytes Relative: 33 %
Lymphs Abs: 2 10*3/uL (ref 0.7–4.0)
MCH: 28.2 pg (ref 26.0–34.0)
MCHC: 31.6 g/dL (ref 30.0–36.0)
MCV: 89.3 fL (ref 80.0–100.0)
Monocytes Absolute: 0.3 10*3/uL (ref 0.1–1.0)
Monocytes Relative: 6 %
Neutro Abs: 3.6 10*3/uL (ref 1.7–7.7)
Neutrophils Relative %: 58 %
Platelets: 239 10*3/uL (ref 150–400)
RBC: 5.03 MIL/uL (ref 4.22–5.81)
RDW: 15.9 % — ABNORMAL HIGH (ref 11.5–15.5)
WBC: 6.2 10*3/uL (ref 4.0–10.5)
nRBC: 0 % (ref 0.0–0.2)

## 2024-03-25 LAB — BASIC METABOLIC PANEL WITH GFR
Anion gap: 10 (ref 5–15)
BUN: 12 mg/dL (ref 6–20)
CO2: 26 mmol/L (ref 22–32)
Calcium: 9.2 mg/dL (ref 8.9–10.3)
Chloride: 103 mmol/L (ref 98–111)
Creatinine, Ser: 0.92 mg/dL (ref 0.61–1.24)
GFR, Estimated: >60 mL/min
Glucose, Bld: 83 mg/dL (ref 70–99)
Potassium: 4.3 mmol/L (ref 3.5–5.1)
Sodium: 140 mmol/L (ref 135–145)

## 2024-03-25 MED ORDER — KETOROLAC TROMETHAMINE 15 MG/ML IJ SOLN
15.0000 mg | Freq: Once | INTRAMUSCULAR | Status: AC
  Administered 2024-03-25: 15 mg via INTRAMUSCULAR
  Filled 2024-03-25: qty 1

## 2024-03-25 NOTE — Discharge Instructions (Signed)
 Return for any problem.  As discussed, follow-up closely with your regular outpatient care provider.

## 2024-03-25 NOTE — ED Triage Notes (Signed)
 Patient to ED via EMS for eval of having chronic pain. No OTC meds tried PTA.

## 2024-03-25 NOTE — ED Provider Notes (Signed)
 " [place] EMERGENCY DEPARTMENT AT [facility] Provider Note   CSN: [id number] Arrival date & time: 03/25/24  1038     Patient presents with: Pain   Chris Hunt is a 59 y.o. male.   59 year old male with prior medical history as detailed below presents for evaluation.  Patient with longstanding history of chronic pain.  Patient reports increased diffuse pain x 2 to 3 days.  She denies fever.  He denies other complaint.  The history is provided by medical records and the patient.       Prior to Admission medications  Medication Sig Start Date End Date Taking? Authorizing Provider  fluticasone  (FLONASE ) 50 MCG/ACT nasal spray Place 1 spray into both nostrils daily for 7 days. 02/07/21 02/14/21  Elnor Savant A, DO  fluticasone  (FLONASE ) 50 MCG/ACT nasal spray Place 2 sprays into both nostrils daily. 12/06/23   Kennyth Domino, FNP  loratadine  (CLARITIN ) 10 MG tablet Take 1 tablet (10 mg total) by mouth daily. 12/06/23   Kennyth Domino, FNP  metoprolol  tartrate (LOPRESSOR ) 50 MG tablet TAKE 1 TABLET BY MOUTH TWICE DAILY 12/02/21   Norleen Lynwood ORN, MD    Allergies: Shellfish allergy and Lactose intolerance (gi)    Review of Systems  All other systems reviewed and are negative.   Updated Vital Signs BP (!) 112/94 (BP Location: Right Arm)   Pulse (!) 57   Temp 98.4 F (36.9 C) (Oral)   Resp 16   SpO2 99%   Physical Exam Vitals and nursing note reviewed.  Constitutional:      General: He is not in acute distress.    Appearance: He is well-developed.  HENT:     Head: Normocephalic and atraumatic.  Eyes:     Conjunctiva/sclera: Conjunctivae normal.  Cardiovascular:     Rate and Rhythm: Normal rate and regular rhythm.     Heart sounds: No murmur heard. Pulmonary:     Effort: Pulmonary effort is normal. No respiratory distress.     Breath sounds: Normal breath sounds.  Abdominal:     Palpations: Abdomen is soft.     Tenderness: There is no abdominal tenderness.   Musculoskeletal:        General: No swelling.     Cervical back: Neck supple.  Skin:    General: Skin is warm and dry.     Capillary Refill: Capillary refill takes less than 2 seconds.  Neurological:     Mental Status: He is alert.  Psychiatric:        Mood and Affect: Mood normal.     (all labs ordered are listed, but only abnormal results are displayed) Labs Reviewed  CBC WITH DIFFERENTIAL/PLATELET - Abnormal; Notable for the following components:      Result Value   RDW 15.9 (*)    All other components within normal limits  BASIC METABOLIC PANEL WITH GFR    EKG: None  Radiology: No results found.   Procedures   Medications Ordered in the ED  ketorolac  (TORADOL ) 15 MG/ML injection 15 mg (15 mg Intramuscular Given 03/25/24 1131)                                    Medical Decision Making Patient presents with complaint of acute on chronic pain.  He is without other specific complaint.  He does not appear to be in distress.  He is nontoxic.  Obtained screening labs  are without significant acute abnormality.  On reevaluation the patient appears improved.  He is asleep in his chair.    He is encouraged to follow-up closely with his regular outpatient care provider for further workup and treatment.  Amount and/or Complexity of Data Reviewed Labs: ordered.  Risk Prescription drug management.        Final diagnoses:  Other chronic pain    ED Discharge Orders     None          Laurice Maude BROCKS, MD 03/25/24 1204  "

## 2024-03-25 NOTE — ED Provider Triage Note (Signed)
 Emergency Medicine Provider Triage Evaluation Note  Chris Hunt , a 59 y.o. male  was evaluated in triage.  Pt complains of pain all over.  Patient reports chronic pain.  He reports that his pain is worse than normal.  Review of Systems  Positive: Pain, diffuse, chronic Negative: Fever, chest pain, shortness of breath  Physical Exam  BP (!) 112/94 (BP Location: Right Arm)   Pulse (!) 57   Temp 98.4 F (36.9 C) (Oral)   Resp 16   SpO2 99%  Gen:   Awake, no distress   Resp:  Normal effort  MSK:   Moves extremities without difficulty  Other:    Medical Decision Making  Medically screening exam initiated at 11:02 AM.  Appropriate orders placed.  Chris Hunt was informed that the remainder of the evaluation will be completed by another provider, this initial triage assessment does not replace that evaluation, and the importance of remaining in the ED until their evaluation is complete.     Chris Maude BROCKS, MD 03/25/24 [phone]

## 2024-03-29 ENCOUNTER — Other Ambulatory Visit: Payer: Self-pay

## 2024-03-29 ENCOUNTER — Emergency Department (HOSPITAL_COMMUNITY): Payer: Self-pay

## 2024-03-29 ENCOUNTER — Emergency Department (HOSPITAL_COMMUNITY)
Admission: EM | Admit: 2024-03-29 | Discharge: 2024-03-30 | Disposition: A | Payer: Self-pay | Attending: Emergency Medicine | Admitting: Emergency Medicine

## 2024-03-29 DIAGNOSIS — I10 Essential (primary) hypertension: Secondary | ICD-10-CM | POA: Insufficient documentation

## 2024-03-29 DIAGNOSIS — Z79899 Other long term (current) drug therapy: Secondary | ICD-10-CM | POA: Insufficient documentation

## 2024-03-29 DIAGNOSIS — T50901A Poisoning by unspecified drugs, medicaments and biological substances, accidental (unintentional), initial encounter: Secondary | ICD-10-CM

## 2024-03-29 DIAGNOSIS — T40711A Poisoning by cannabis, accidental (unintentional), initial encounter: Secondary | ICD-10-CM | POA: Insufficient documentation

## 2024-03-29 LAB — COMPREHENSIVE METABOLIC PANEL WITH GFR
ALT: 19 U/L (ref 0–44)
AST: 23 U/L (ref 15–41)
Albumin: 4.2 g/dL (ref 3.5–5.0)
Alkaline Phosphatase: 100 U/L (ref 38–126)
Anion gap: 14 (ref 5–15)
BUN: 9 mg/dL (ref 6–20)
CO2: 21 mmol/L — ABNORMAL LOW (ref 22–32)
Calcium: 9.3 mg/dL (ref 8.9–10.3)
Chloride: 104 mmol/L (ref 98–111)
Creatinine, Ser: 1.05 mg/dL (ref 0.61–1.24)
GFR, Estimated: >60 mL/min
Glucose, Bld: 78 mg/dL (ref 70–99)
Potassium: 4 mmol/L (ref 3.5–5.1)
Sodium: 139 mmol/L (ref 135–145)
Total Bilirubin: 0.4 mg/dL (ref 0.0–1.2)
Total Protein: 7.2 g/dL (ref 6.5–8.1)

## 2024-03-29 LAB — ACETAMINOPHEN LEVEL: Acetaminophen (Tylenol), Serum: <10 ug/mL — ABNORMAL LOW (ref 10–30)

## 2024-03-29 LAB — CBC
HCT: 45.2 % (ref 39.0–52.0)
Hemoglobin: 13.8 g/dL (ref 13.0–17.0)
MCH: 27.7 pg (ref 26.0–34.0)
MCHC: 30.5 g/dL (ref 30.0–36.0)
MCV: 90.8 fL (ref 80.0–100.0)
Platelets: 163 10*3/uL (ref 150–400)
RBC: 4.98 MIL/uL (ref 4.22–5.81)
RDW: 15.8 % — ABNORMAL HIGH (ref 11.5–15.5)
WBC: 4.6 10*3/uL (ref 4.0–10.5)
nRBC: 0 % (ref 0.0–0.2)

## 2024-03-29 LAB — SALICYLATE LEVEL: Salicylate Lvl: <7 mg/dL — ABNORMAL LOW (ref 7.0–30.0)
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
## 2024-03-29 NOTE — Discharge Instructions (Addendum)
 You are seen today for accidental drug overdose.  Recommending continue to monitor symptoms, and if you ever have desires to overuse, hurt your self or think about dying, please return to the ED for further evaluation or to behavioral health urgent care.  Your x-rays and imaging were very reassuring at this time.  Lab work also very reassuring.
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
## 2024-03-30 LAB — ETHANOL: Alcohol, Ethyl (B): 49 mg/dL — ABNORMAL HIGH
